# Patient Record
Sex: Female | Born: 1960 | Race: White | Hispanic: No | Marital: Married | Smoking: Former smoker
Health system: Southern US, Community
[De-identification: ages and names within clinical notes are randomized; demographics above are authoritative.]

## PROBLEM LIST (undated history)

## (undated) DIAGNOSIS — B019 Varicella without complication: Secondary | ICD-10-CM

## (undated) DIAGNOSIS — T7840XA Allergy, unspecified, initial encounter: Secondary | ICD-10-CM

## (undated) DIAGNOSIS — E119 Type 2 diabetes mellitus without complications: Secondary | ICD-10-CM

## (undated) DIAGNOSIS — I509 Heart failure, unspecified: Secondary | ICD-10-CM

## (undated) DIAGNOSIS — K8020 Calculus of gallbladder without cholecystitis without obstruction: Secondary | ICD-10-CM

## (undated) HISTORY — PX: ABDOMINAL HYSTERECTOMY: SHX81

## (undated) HISTORY — DX: Allergy, unspecified, initial encounter: T78.40XA

## (undated) HISTORY — PX: CHOLECYSTECTOMY: SHX55

## (undated) HISTORY — DX: Heart failure, unspecified: I50.9

## (undated) HISTORY — PX: SPINE SURGERY: SHX786

## (undated) HISTORY — DX: Type 2 diabetes mellitus without complications: E11.9

## (undated) HISTORY — DX: Calculus of gallbladder without cholecystitis without obstruction: K80.20

## (undated) HISTORY — DX: Varicella without complication: B01.9

---

## 1997-09-30 ENCOUNTER — Ambulatory Visit (HOSPITAL_COMMUNITY): Admission: RE | Admit: 1997-09-30 | Discharge: 1997-09-30 | Payer: Self-pay | Admitting: *Deleted

## 1998-06-22 ENCOUNTER — Other Ambulatory Visit: Admission: RE | Admit: 1998-06-22 | Discharge: 1998-06-22 | Payer: Self-pay | Admitting: *Deleted

## 1999-07-18 ENCOUNTER — Other Ambulatory Visit: Admission: RE | Admit: 1999-07-18 | Discharge: 1999-07-18 | Payer: Self-pay | Admitting: *Deleted

## 2012-10-28 ENCOUNTER — Encounter (INDEPENDENT_AMBULATORY_CARE_PROVIDER_SITE_OTHER): Payer: Self-pay

## 2012-10-29 ENCOUNTER — Other Ambulatory Visit: Payer: Self-pay | Admitting: Obstetrics and Gynecology

## 2012-10-29 DIAGNOSIS — R1011 Right upper quadrant pain: Secondary | ICD-10-CM

## 2012-10-31 ENCOUNTER — Ambulatory Visit (INDEPENDENT_AMBULATORY_CARE_PROVIDER_SITE_OTHER): Payer: BC Managed Care – PPO | Admitting: Surgery

## 2012-11-03 ENCOUNTER — Ambulatory Visit
Admission: RE | Admit: 2012-11-03 | Discharge: 2012-11-03 | Disposition: A | Discharge status: Home / Self Care | Payer: BLUE CROSS/BLUE SHIELD | Source: Ambulatory Visit | Attending: Obstetrics and Gynecology | Admitting: Obstetrics and Gynecology

## 2012-11-03 DIAGNOSIS — R1011 Right upper quadrant pain: Secondary | ICD-10-CM

## 2013-09-28 ENCOUNTER — Other Ambulatory Visit: Payer: Self-pay | Admitting: Obstetrics and Gynecology

## 2013-09-28 DIAGNOSIS — N63 Unspecified lump in unspecified breast: Secondary | ICD-10-CM

## 2013-10-05 ENCOUNTER — Other Ambulatory Visit: Payer: Self-pay | Admitting: Obstetrics and Gynecology

## 2013-10-05 ENCOUNTER — Ambulatory Visit
Admission: RE | Admit: 2013-10-05 | Discharge: 2013-10-05 | Disposition: A | Discharge status: Home / Self Care | Payer: BC Managed Care – PPO | Source: Ambulatory Visit | Attending: Obstetrics and Gynecology | Admitting: Obstetrics and Gynecology

## 2013-10-05 DIAGNOSIS — N63 Unspecified lump in unspecified breast: Secondary | ICD-10-CM

## 2013-10-05 DIAGNOSIS — N6489 Other specified disorders of breast: Secondary | ICD-10-CM

## 2013-10-05 DIAGNOSIS — R928 Other abnormal and inconclusive findings on diagnostic imaging of breast: Secondary | ICD-10-CM

## 2014-06-25 ENCOUNTER — Other Ambulatory Visit: Payer: Self-pay

## 2015-06-23 ENCOUNTER — Other Ambulatory Visit: Payer: Self-pay | Admitting: Obstetrics and Gynecology

## 2015-06-23 DIAGNOSIS — N6489 Other specified disorders of breast: Secondary | ICD-10-CM

## 2015-06-30 ENCOUNTER — Other Ambulatory Visit: Payer: Self-pay

## 2015-07-08 ENCOUNTER — Ambulatory Visit
Admission: RE | Admit: 2015-07-08 | Discharge: 2015-07-08 | Disposition: A | Discharge status: Home / Self Care | Payer: BLUE CROSS/BLUE SHIELD | Source: Ambulatory Visit | Attending: Obstetrics and Gynecology | Admitting: Obstetrics and Gynecology

## 2015-07-08 DIAGNOSIS — N6489 Other specified disorders of breast: Secondary | ICD-10-CM

## 2017-03-05 ENCOUNTER — Encounter: Payer: Self-pay | Admitting: Internal Medicine

## 2017-03-05 ENCOUNTER — Ambulatory Visit (INDEPENDENT_AMBULATORY_CARE_PROVIDER_SITE_OTHER): Payer: BLUE CROSS/BLUE SHIELD | Admitting: Internal Medicine

## 2017-03-05 ENCOUNTER — Ambulatory Visit (INDEPENDENT_AMBULATORY_CARE_PROVIDER_SITE_OTHER)
Admission: RE | Admit: 2017-03-05 | Discharge: 2017-03-05 | Disposition: A | Discharge status: Home / Self Care | Payer: BLUE CROSS/BLUE SHIELD | Source: Ambulatory Visit | Attending: Internal Medicine | Admitting: Internal Medicine

## 2017-03-05 VITALS — BP 130/78 | HR 73 | Temp 98.4°F | Ht 65.75 in | Wt 187.0 lb

## 2017-03-05 DIAGNOSIS — M5410 Radiculopathy, site unspecified: Secondary | ICD-10-CM

## 2017-03-05 DIAGNOSIS — Z01818 Encounter for other preprocedural examination: Secondary | ICD-10-CM | POA: Insufficient documentation

## 2017-03-05 DIAGNOSIS — Z8249 Family history of ischemic heart disease and other diseases of the circulatory system: Secondary | ICD-10-CM | POA: Insufficient documentation

## 2017-03-05 DIAGNOSIS — K8020 Calculus of gallbladder without cholecystitis without obstruction: Secondary | ICD-10-CM | POA: Insufficient documentation

## 2017-03-05 HISTORY — DX: Family history of ischemic heart disease and other diseases of the circulatory system: Z82.49

## 2017-03-05 HISTORY — DX: Encounter for other preprocedural examination: Z01.818

## 2017-03-05 HISTORY — DX: Calculus of gallbladder without cholecystitis without obstruction: K80.20

## 2017-03-05 MED ORDER — VITAMIN D3 50 MCG (2000 UT) PO CAPS: 2000.0000 [IU] | ORAL_CAPSULE | Freq: Every day | ORAL | 3 refills | Status: AC

## 2017-03-05 NOTE — Progress Notes (Signed)
Subjective:  Patient ID: Kristy Keller, female    DOB: 23-Oct-1960  Age: 56 y.o. MRN: [id number]  CC: No chief complaint on file.   HPI Kristy Keller presents for a new pt visit Req by Dr Sherlyn Lick Reason: back surgery (L4 decompression and fusion) med clearance Hx: 56 yo w/LBP irrad down to the R leg x 4 weeks. It started on 01/26/17 when she lifted a heavy roll of fabric at work. RLE weakness is getting worse.  She has a h/o single large gallstone 2014 - never removed; pain off and on x years   Past Medical History:  Diagnosis Date  . Chicken pox   . Gallstones    Past Surgical History:  Procedure Laterality Date  . ABDOMINAL HYSTERECTOMY     partial    reports that she has quit smoking. She has never used smokeless tobacco. She reports that she does not drink alcohol or use drugs. family history includes Cancer (age of onset: 52) in her father; Diabetes in her sister; Heart disease (age of onset: 51) in her brother; Heart disease (age of onset: 35) in her mother. No Known Allergies  No outpatient prescriptions prior to visit.   No facility-administered medications prior to visit.     ROS Review of Systems  Constitutional: Negative for activity change, appetite change, chills, fatigue and unexpected weight change.  HENT: Negative for congestion, mouth sores and sinus pressure.   Eyes: Negative for visual disturbance.  Respiratory: Negative for cough and chest tightness.   Gastrointestinal: Positive for abdominal pain. Negative for nausea.  Genitourinary: Negative for difficulty urinating, frequency and vaginal pain.  Musculoskeletal: Positive for back pain and gait problem.  Skin: Negative for pallor and rash.  Neurological: Positive for weakness. Negative for dizziness, tremors, numbness and headaches.  Psychiatric/Behavioral: Negative for confusion, sleep disturbance and suicidal ideas. The patient is not nervous/anxious.     Objective:  BP 130/78 (BP  Location: Left Arm, Patient Position: Sitting, Cuff Size: Large)   Pulse 73   Temp 98.4 F (36.9 C) (Oral)   Ht 5' 5.75" (1.67 m)   Wt 187 lb (84.8 kg)   SpO2 98%   BMI 30.41 kg/m   BP Readings from Last 3 Encounters:  03/05/17 130/78    Wt Readings from Last 3 Encounters:  03/05/17 187 lb (84.8 kg)    Physical Exam  Constitutional: She appears well-developed. No distress.  HENT:  Head: Normocephalic.  Right Ear: External ear normal.  Left Ear: External ear normal.  Nose: Nose normal.  Mouth/Throat: Oropharynx is clear and moist.  Eyes: Pupils are equal, round, and reactive to light. Conjunctivae are normal. Right eye exhibits no discharge. Left eye exhibits no discharge.  Neck: Normal range of motion. Neck supple. No JVD present. No tracheal deviation present. No thyromegaly present.  Cardiovascular: Normal rate, regular rhythm and normal heart sounds.   Pulmonary/Chest: No stridor. No respiratory distress. She has no wheezes.  Abdominal: Soft. Bowel sounds are normal. She exhibits no distension and no mass. There is no tenderness. There is no rebound and no guarding.  Musculoskeletal: She exhibits tenderness. She exhibits no edema.  Lymphadenopathy:    She has no cervical adenopathy.  Neurological: She displays abnormal reflex. No cranial nerve deficit. She exhibits abnormal muscle tone. Coordination abnormal.  Skin: No rash noted. No erythema.  Psychiatric: She has a normal mood and affect. Her behavior is normal. Judgment and thought content normal.  RLE weak 3+/5 strength in knee,  ankle extensors and flexors; no knee, achilles reflex R thigh extensors/flexors 4+/5 Str leg (-) B LS tender w/ROM Limp  Procedure: EKG Indication: pre-op Impression: NSR. No acute changes.   No results found for: WBC, HGB, HCT, PLT, GLUCOSE, CHOL, TRIG, HDL, LDLDIRECT, LDLCALC, ALT, AST, NA, K, CL, CREATININE, BUN, CO2, TSH, PSA, INR, GLUF, HGBA1C, MICROALBUR  Mm Diag Breast Tomo  Bilateral  Result Date: 07/08/2015 CLINICAL DATA:  Delayed follow-up for a screening recall of a left breast asymmetry. EXAM: DIGITAL DIAGNOSTIC BILATERAL MAMMOGRAM WITH 3D TOMOSYNTHESIS AND CAD COMPARISON:  Previous exam(s). ACR Breast Density Category b: There are scattered areas of fibroglandular density. FINDINGS: The asymmetry of concern in the subareolar left breast resolves on today's images, most likely representing overlapping fibroglandular tissue. No suspicious calcifications, masses or areas of distortion are seen in the bilateral breasts. Mammographic images were processed with CAD. IMPRESSION: 1. Resolution of the asymmetry of concern in the subareolar left breast. This most likely represented overlapping fibroglandular tissue. 2.  No mammographic evidence of malignancy in the bilateral breasts. RECOMMENDATION: Screening mammogram in one year.(Code:[id number]-B-01Y) I have discussed the findings and recommendations with the patient. Results were also provided in writing at the conclusion of the visit. If applicable, a reminder letter will be sent to the patient regarding the next appointment. BI-RADS CATEGORY  1: Negative. Electronically Signed   By: Ammie Ferrier M.D.   On: 07/08/2015 15:57    Assessment & Plan:   There are no diagnoses linked to this encounter. I am having Ms. Haacke maintain her gabapentin, estrogen (conjugated)-medroxyprogesterone, and aspirin EC.  Meds ordered this encounter  Medications  . gabapentin (NEURONTIN) 300 MG capsule    Sig: TAKE 1 CAPSULE IN THE MORNING AND TAKE 2 CAPSULES AT BEDTIME    Refill:  1  . estrogen, conjugated,-medroxyprogesterone (PREMPRO) 0.3-1.5 MG tablet    Sig: Take by mouth.  Marland Kitchen aspirin EC 81 MG tablet    Sig: Take 81 mg by mouth daily.     Follow-up: No Follow-up on file.  Walker Kehr, MD

## 2017-03-05 NOTE — Patient Instructions (Signed)
Use [employer] Peroxicare tooth paste  

## 2017-03-05 NOTE — Assessment & Plan Note (Addendum)
EKG - nl Labs Stress test discussed. Kristy Keller declined. She was very active prior to her radiculopathy w/o CPs Baby ASA qd to continue

## 2017-03-05 NOTE — Assessment & Plan Note (Addendum)
Repeat abdominal US Low fat diet Labs

## 2017-03-05 NOTE — Assessment & Plan Note (Addendum)
Kristy Keller is medically clear for her L4 decompression/fusion. Thank you! EKG - nl Labs, CXR - OK Stress test discussed. Kristy Keller declined. She was very active prior to her radiculopathy w/o CPs Baby ASA qd Repeat abdominal US to f/u on her gallstone Low fat diet

## 2017-03-06 DIAGNOSIS — M5410 Radiculopathy, site unspecified: Secondary | ICD-10-CM

## 2017-03-06 HISTORY — DX: Radiculopathy, site unspecified: M54.10

## 2017-03-06 NOTE — Assessment & Plan Note (Signed)
Kristy Keller is medically clear for her L4 decompression/fusion. Thank you! EKG - nl Labs, CXR - OK Stress test discussed. Kristy Keller declined. She was very active prior to her radiculopathy w/o CPs Baby ASA qd Repeat abdominal US to f/u on her gallstone Low fat diet

## 2017-03-07 ENCOUNTER — Other Ambulatory Visit (INDEPENDENT_AMBULATORY_CARE_PROVIDER_SITE_OTHER): Payer: BLUE CROSS/BLUE SHIELD

## 2017-03-07 DIAGNOSIS — Z01818 Encounter for other preprocedural examination: Secondary | ICD-10-CM

## 2017-03-07 LAB — BASIC METABOLIC PANEL
BUN: 10 mg/dL (ref 6–23)
CALCIUM: 9.9 mg/dL (ref 8.4–10.5)
CO2: 32 meq/L (ref 19–32)
CREATININE: 0.99 mg/dL (ref 0.40–1.20)
Chloride: 105 mEq/L (ref 96–112)
GFR: 61.29 mL/min (ref >60.00)
GLUCOSE: 84 mg/dL (ref 70–99)
POTASSIUM: 4.3 meq/L (ref 3.5–5.1)
SODIUM: 143 meq/L (ref 135–145)

## 2017-03-07 LAB — TSH: TSH: 0.31 u[IU]/mL — ABNORMAL LOW (ref 0.35–4.50)

## 2017-03-07 LAB — CBC WITH DIFFERENTIAL/PLATELET
BASOS ABS: 0 10*3/uL (ref 0.0–0.1)
Basophils Relative: 0.8 % (ref 0.0–3.0)
EOS ABS: 0 10*3/uL (ref 0.0–0.7)
Eosinophils Relative: 0.6 % (ref 0.0–5.0)
HCT: 43.8 % (ref 36.0–46.0)
Hemoglobin: 14.6 g/dL (ref 12.0–15.0)
LYMPHS ABS: 1.6 10*3/uL (ref 0.7–4.0)
LYMPHS PCT: 34.5 % (ref 12.0–46.0)
MCHC: 33.4 g/dL (ref 30.0–36.0)
MCV: 92.1 fl (ref 78.0–100.0)
Monocytes Absolute: 0.4 10*3/uL (ref 0.1–1.0)
Monocytes Relative: 7.8 % (ref 3.0–12.0)
NEUTROS ABS: 2.5 10*3/uL (ref 1.4–7.7)
NEUTROS PCT: 56.3 % (ref 43.0–77.0)
PLATELETS: 271 10*3/uL (ref 150.0–400.0)
RBC: 4.76 Mil/uL (ref 3.87–5.11)
RDW: 13.5 % (ref 11.5–15.5)
WBC: 4.5 10*3/uL (ref 4.0–10.5)

## 2017-03-07 LAB — APTT: aPTT: 29.3 s (ref 23.4–32.7)

## 2017-03-07 LAB — HEPATIC FUNCTION PANEL
ALK PHOS: 41 U/L (ref 39–117)
ALT: 34 U/L (ref 0–35)
AST: 21 U/L (ref 0–37)
Albumin: 4.4 g/dL (ref 3.5–5.2)
BILIRUBIN DIRECT: 0.2 mg/dL (ref 0.0–0.3)
Total Bilirubin: 0.9 mg/dL (ref 0.2–1.2)
Total Protein: 7 g/dL (ref 6.0–8.3)

## 2017-03-07 LAB — URINALYSIS
Bilirubin Urine: NEGATIVE
Hgb urine dipstick: NEGATIVE
KETONES UR: NEGATIVE
Leukocytes, UA: NEGATIVE
Nitrite: NEGATIVE
PH: 5.5 (ref 5.0–8.0)
Total Protein, Urine: NEGATIVE
URINE GLUCOSE: NEGATIVE
Urobilinogen, UA: 0.2 (ref 0.0–1.0)

## 2017-03-07 LAB — PROTIME-INR
INR: 1.1 ratio — ABNORMAL HIGH (ref 0.8–1.0)
PROTHROMBIN TIME: 11.4 s (ref 9.6–13.1)

## 2017-03-07 LAB — LIPID PANEL
CHOL/HDL RATIO: 3
Cholesterol: 133 mg/dL (ref 0–200)
HDL: 47.7 mg/dL (ref >39.00)
LDL Cholesterol: 71 mg/dL (ref 0–99)
NONHDL: 85.45
Triglycerides: 73 mg/dL (ref 0.0–149.0)
VLDL: 14.6 mg/dL (ref 0.0–40.0)

## 2017-03-27 ENCOUNTER — Ambulatory Visit
Admission: RE | Admit: 2017-03-27 | Discharge: 2017-03-27 | Disposition: A | Discharge status: Home / Self Care | Payer: BLUE CROSS/BLUE SHIELD | Source: Ambulatory Visit | Attending: Internal Medicine | Admitting: Internal Medicine

## 2017-03-27 DIAGNOSIS — Z01818 Encounter for other preprocedural examination: Secondary | ICD-10-CM

## 2017-03-27 DIAGNOSIS — K8020 Calculus of gallbladder without cholecystitis without obstruction: Secondary | ICD-10-CM

## 2017-04-01 ENCOUNTER — Telehealth: Payer: Self-pay | Admitting: Internal Medicine

## 2017-04-01 NOTE — Telephone Encounter (Signed)
Pt is scheduled to have surgery on 11.7, they receive the surgical clearance form but  They need to know if it is ok for the patient to stop taking Aspirin 7 days prior to her surgery. Please advise   Please fax note giving premission to fax number [phone]

## 2017-04-02 ENCOUNTER — Encounter: Payer: Self-pay | Admitting: Internal Medicine

## 2017-04-02 NOTE — Telephone Encounter (Signed)
Okay to write

## 2017-04-03 ENCOUNTER — Telehealth: Payer: Self-pay | Admitting: Internal Medicine

## 2017-04-03 NOTE — Telephone Encounter (Signed)
Patient is having surgery 11/7.  Has faxed surgery clear ence over.  Needs back ASAP.  Will fax back again to provider on A side in case not received the first time.  Please fax back to [phone] Attn: Gabriela Eves.

## 2017-04-03 NOTE — Telephone Encounter (Signed)
Okay faxed.

## 2017-04-03 NOTE — Telephone Encounter (Signed)
faxed

## 2017-04-03 NOTE — Telephone Encounter (Signed)
Ok Thx 

## 2017-04-12 DIAGNOSIS — M4727 Other spondylosis with radiculopathy, lumbosacral region: Secondary | ICD-10-CM | POA: Insufficient documentation

## 2017-04-12 HISTORY — DX: Other spondylosis with radiculopathy, lumbosacral region: M47.27

## 2017-04-12 MED ORDER — ACETAMINOPHEN 325 MG PO TABS
650.00 mg | ORAL_TABLET | ORAL | Status: DC
Start: ? — End: 2017-04-12

## 2017-04-12 MED ORDER — HYDROCODONE-ACETAMINOPHEN 5-325 MG PO TABS
1.00 | ORAL_TABLET | ORAL | Status: DC
Start: ? — End: 2017-04-12

## 2017-04-12 MED ORDER — OXYCODONE-ACETAMINOPHEN 5-325 MG PO TABS
1.00 | ORAL_TABLET | ORAL | Status: DC
Start: ? — End: 2017-04-12

## 2017-04-12 MED ORDER — DOCUSATE SODIUM 100 MG PO CAPS
100.00 mg | ORAL_CAPSULE | ORAL | Status: DC
Start: 2017-04-12 — End: 2017-04-12

## 2017-04-12 MED ORDER — HYDROCODONE-ACETAMINOPHEN 5-325 MG PO TABS
2.00 | ORAL_TABLET | ORAL | Status: DC
Start: ? — End: 2017-04-12

## 2017-04-12 MED ORDER — PHENOL 1.4 % MT LIQD
1.00 | OROMUCOSAL | Status: DC
Start: ? — End: 2017-04-12

## 2017-04-12 MED ORDER — CHOLECALCIFEROL 25 MCG (1000 UT) PO TABS
2000.00 | ORAL_TABLET | ORAL | Status: DC
Start: 2017-04-12 — End: 2017-04-12

## 2017-04-12 MED ORDER — ONDANSETRON HCL 4 MG/2ML IJ SOLN
4.00 mg | INTRAMUSCULAR | Status: DC
Start: ? — End: 2017-04-12

## 2017-04-12 MED ORDER — POTASSIUM CHLORIDE IN NACL 20-0.9 MEQ/L-% IV SOLN
INTRAVENOUS | Status: DC
Start: ? — End: 2017-04-12

## 2017-04-12 MED ORDER — ESTROGENS CONJUGATED 0.625 MG PO TABS
1.25 mg | ORAL_TABLET | ORAL | Status: DC
Start: 2017-04-12 — End: 2017-04-12

## 2017-04-12 MED ORDER — METHOCARBAMOL 750 MG PO TABS
750.00 mg | ORAL_TABLET | ORAL | Status: DC
Start: 2017-04-12 — End: 2017-04-12

## 2017-04-12 MED ORDER — MORPHINE SULFATE (PF) 2 MG/ML IV SOLN
2.00 mg | INTRAVENOUS | Status: DC
Start: ? — End: 2017-04-12

## 2017-04-12 MED ORDER — SENNOSIDES-DOCUSATE SODIUM 8.6-50 MG PO TABS
1.00 | ORAL_TABLET | ORAL | Status: DC
Start: 2017-04-12 — End: 2017-04-12

## 2017-04-12 MED ORDER — PROMETHAZINE HCL 25 MG PO TABS
25.00 mg | ORAL_TABLET | ORAL | Status: DC
Start: ? — End: 2017-04-12

## 2017-04-12 MED ORDER — GABAPENTIN 300 MG PO CAPS
300.00 mg | ORAL_CAPSULE | ORAL | Status: DC
Start: 2017-04-12 — End: 2017-04-12

## 2017-04-12 MED ORDER — KETOROLAC TROMETHAMINE 15 MG/ML IJ SOLN
15.00 mg | INTRAMUSCULAR | Status: DC
Start: ? — End: 2017-04-12

## 2017-05-22 DIAGNOSIS — M5431 Sciatica, right side: Secondary | ICD-10-CM | POA: Diagnosis not present

## 2017-05-22 DIAGNOSIS — Z79891 Long term (current) use of opiate analgesic: Secondary | ICD-10-CM | POA: Diagnosis not present

## 2017-05-22 DIAGNOSIS — M48062 Spinal stenosis, lumbar region with neurogenic claudication: Secondary | ICD-10-CM | POA: Diagnosis not present

## 2017-05-22 DIAGNOSIS — Z981 Arthrodesis status: Secondary | ICD-10-CM | POA: Diagnosis not present

## 2017-05-22 DIAGNOSIS — M4726 Other spondylosis with radiculopathy, lumbar region: Secondary | ICD-10-CM | POA: Diagnosis not present

## 2017-05-22 DIAGNOSIS — M5116 Intervertebral disc disorders with radiculopathy, lumbar region: Secondary | ICD-10-CM | POA: Diagnosis not present

## 2017-05-22 DIAGNOSIS — G43909 Migraine, unspecified, not intractable, without status migrainosus: Secondary | ICD-10-CM | POA: Diagnosis not present

## 2017-05-22 DIAGNOSIS — Z4789 Encounter for other orthopedic aftercare: Secondary | ICD-10-CM | POA: Diagnosis not present

## 2017-06-05 ENCOUNTER — Ambulatory Visit: Payer: BLUE CROSS/BLUE SHIELD | Admitting: Internal Medicine

## 2017-06-05 ENCOUNTER — Encounter: Payer: Self-pay | Admitting: Internal Medicine

## 2017-06-05 DIAGNOSIS — Z8249 Family history of ischemic heart disease and other diseases of the circulatory system: Secondary | ICD-10-CM | POA: Diagnosis not present

## 2017-06-05 DIAGNOSIS — R609 Edema, unspecified: Secondary | ICD-10-CM

## 2017-06-05 DIAGNOSIS — M5410 Radiculopathy, site unspecified: Secondary | ICD-10-CM | POA: Diagnosis not present

## 2017-06-05 HISTORY — DX: Edema, unspecified: R60.9

## 2017-06-05 NOTE — Assessment & Plan Note (Signed)
LBP - s/p L4 decompr and fusion surgery Apr 10, 2017. LBP and RLE pain is better She will start PT

## 2017-06-05 NOTE — Assessment & Plan Note (Addendum)
Due to gabapentin - resolved off Rx

## 2017-06-05 NOTE — Assessment & Plan Note (Signed)
ASA

## 2017-06-05 NOTE — Progress Notes (Signed)
Subjective:  Patient ID: Kristy Keller, female    DOB: [birth year]  Age: 57 y.o. MRN: [id number]  CC: No chief complaint on file.   HPI ALESA ECHEVARRIA presents for LBP - s/p L4 decompr and fusion surgery Apr 10, 2017. LBP and RLE pain is better  Outpatient Medications Prior to Visit  Medication Sig Dispense Refill  . acetaminophen (TYLENOL) 500 MG tablet Take 500 mg by mouth every 5 (five) hours as needed.    Marland Kitchen aspirin EC 81 MG tablet Take 81 mg by mouth daily.    . Cholecalciferol (VITAMIN D3) 2000 units capsule Take 1 capsule (2,000 Units total) by mouth daily. 100 capsule 3  . estrogen, conjugated,-medroxyprogesterone (PREMPRO) 0.3-1.5 MG tablet Take by mouth.    . gabapentin (NEURONTIN) 300 MG capsule TAKE 1 CAPSULE IN THE MORNING AND TAKE 2 CAPSULES AT BEDTIME  1   No facility-administered medications prior to visit.     ROS Review of Systems  Constitutional: Negative for activity change, appetite change, chills, fatigue and unexpected weight change.  HENT: Negative for congestion, mouth sores and sinus pressure.   Eyes: Negative for visual disturbance.  Respiratory: Negative for cough and chest tightness.   Cardiovascular: Negative for leg swelling.  Gastrointestinal: Negative for abdominal pain and nausea.  Genitourinary: Negative for difficulty urinating, frequency and vaginal pain.  Musculoskeletal: Positive for back pain and gait problem.  Skin: Negative for pallor and rash.  Neurological: Negative for dizziness, tremors, weakness, numbness and headaches.  Psychiatric/Behavioral: Negative for confusion and sleep disturbance.    Objective:  BP 130/84 (BP Location: Left Arm, Patient Position: Sitting, Cuff Size: Large)   Pulse 83   Temp 98.3 F (36.8 C) (Oral)   Ht 5' 5.75" (1.67 m)   Wt 201 lb (91.2 kg)   SpO2 98%   BMI 32.69 kg/m   BP Readings from Last 3 Encounters:  06/05/17 130/84  03/05/17 130/78    Wt Readings from Last 3 Encounters:    06/05/17 201 lb (91.2 kg)  03/05/17 187 lb (84.8 kg)    Physical Exam  Constitutional: She appears well-developed. No distress.  HENT:  Head: Normocephalic.  Right Ear: External ear normal.  Left Ear: External ear normal.  Nose: Nose normal.  Mouth/Throat: Oropharynx is clear and moist.  Eyes: Conjunctivae are normal. Pupils are equal, round, and reactive to light. Right eye exhibits no discharge. Left eye exhibits no discharge.  Neck: Normal range of motion. Neck supple. No JVD present. No tracheal deviation present. No thyromegaly present.  Cardiovascular: Normal rate, regular rhythm and normal heart sounds.  Pulmonary/Chest: No stridor. No respiratory distress. She has no wheezes.  Abdominal: Soft. Bowel sounds are normal. She exhibits no distension and no mass. There is no tenderness. There is no rebound and no guarding.  Musculoskeletal: She exhibits tenderness. She exhibits no edema.  Lymphadenopathy:    She has no cervical adenopathy.  Neurological: She displays normal reflexes. No cranial nerve deficit. She exhibits normal muscle tone. Coordination abnormal.  Skin: No rash noted. No erythema.  Psychiatric: She has a normal mood and affect. Her behavior is normal. Judgment and thought content normal.  Cane, limp LS tender w/ROM Scar is healing  Lab Results  Component Value Date   WBC 4.5 03/07/2017   HGB 14.6 03/07/2017   HCT 43.8 03/07/2017   PLT 271.0 03/07/2017   GLUCOSE 84 03/07/2017   CHOL 133 03/07/2017   TRIG 73.0 03/07/2017   HDL 47.70 03/07/2017  LDLCALC 71 03/07/2017   ALT 34 03/07/2017   AST 21 03/07/2017   NA 143 03/07/2017   K 4.3 03/07/2017   CL 105 03/07/2017   CREATININE 0.99 03/07/2017   BUN 10 03/07/2017   CO2 32 03/07/2017   TSH 0.31 (L) 03/07/2017   INR 1.1 (H) 03/07/2017    US Abdomen Complete  Result Date: 03/27/2017 CLINICAL DATA:  Previous cholelithiasis EXAM: ABDOMEN ULTRASOUND COMPLETE COMPARISON:  November 03, 2012 FINDINGS:  Gallbladder: Within the gallbladder, there are several confluent echogenic foci which move and shadow consistent with cholelithiasis. The confluence of gallstones measures 2.5 cm in length, similar to prior study. There is no appreciable gallbladder wall thickening or pericholecystic fluid. No sonographic Murphy sign noted by sonographer. Common bile duct: Diameter: 4 mm. No intrahepatic, common hepatic, or common bile duct dilatation. Liver: There is a cyst in the posterior segment right lobe of the liver near the hepatorenal fossa measuring 2.3 x 2.0 x 2.4 cm. No other focal liver lesion is evident. Liver echogenicity overall is increased. Portal vein is patent on color Doppler imaging with normal direction of blood flow towards the liver. IVC: No abnormality visualized. Pancreas: Visualized portion unremarkable. Portions of pancreas obscured by gas. Spleen: Size and appearance within normal limits. Right Kidney: Length: 10.5 cm. Echogenicity within normal limits. No mass or hydronephrosis visualized. Left Kidney: Length: 12.3 cm. Echogenicity within normal limits. No mass or hydronephrosis visualized. Abdominal aorta: No aneurysm visualized. Other findings: No demonstrable ascites. IMPRESSION: 1. Cholelithiasis with confluence of gallstones measuring 2.5 cm in length, similar to prior study. No gallbladder wall thickening or pericholecystic fluid. 2. Cyst in right lobe of liver. No other focal liver lesions are identified. Increased liver echogenicity most likely is indicative of hepatic steatosis. It should be noted that the sensitivity of ultrasound for detection of noncystic liver lesions is diminished in this circumstance. 3. Portions of pancreas obscured by gas. Visualized portions of pancreas appear normal. 4.  Study otherwise unremarkable. Electronically Signed   By: Lowella Grip III M.D.   On: 03/27/2017 11:16    Assessment & Plan:   There are no diagnoses linked to this encounter. I have  discontinued Alfred C. Chiquito's gabapentin. I am also having her maintain her estrogen (conjugated)-medroxyprogesterone, aspirin EC, Vitamin D3, and acetaminophen.  No orders of the defined types were placed in this encounter.    Follow-up: No Follow-up on file.  Walker Kehr, MD

## 2017-07-19 ENCOUNTER — Encounter: Payer: Self-pay | Admitting: Internal Medicine

## 2017-07-19 ENCOUNTER — Ambulatory Visit: Payer: BLUE CROSS/BLUE SHIELD | Admitting: Internal Medicine

## 2017-07-19 DIAGNOSIS — L723 Sebaceous cyst: Secondary | ICD-10-CM | POA: Diagnosis not present

## 2017-07-19 DIAGNOSIS — L089 Local infection of the skin and subcutaneous tissue, unspecified: Secondary | ICD-10-CM | POA: Diagnosis not present

## 2017-07-19 HISTORY — DX: Local infection of the skin and subcutaneous tissue, unspecified: L08.9

## 2017-07-19 MED ORDER — DOXYCYCLINE HYCLATE 100 MG PO TABS: 100.0000 mg | ORAL_TABLET | Freq: Two times a day (BID) | ORAL | 0 refills | Status: DC

## 2017-07-19 MED ORDER — MUPIROCIN 2 % EX OINT: TOPICAL_OINTMENT | CUTANEOUS | 0 refills | Status: DC

## 2017-07-19 NOTE — Patient Instructions (Addendum)
Epidermal Cyst Removal, Care After Refer to this sheet in the next few weeks. These instructions provide you with information about caring for yourself after your procedure. Your health care provider may also give you more specific instructions. Your treatment has been planned according to current medical practices, but problems sometimes occur. Call your health care provider if you have any problems or questions after your procedure. What can I expect after the procedure? After the procedure, it is common to have:  Soreness in the area where your cyst was removed.  Tightness or itching from your skin sutures.  Follow these instructions at home:  Take medicines only as directed by your health care provider.  If you were prescribed an antibiotic medicine, finish all of it even if you start to feel better.  Use antibiotic ointment as directed by your health care provider. Follow the instructions carefully.  There are many different ways to close and cover an incision, including stitches (sutures), skin glue, and adhesive strips. Follow your health care provider's instructions about: ? Incision care. ? Bandage (dressing) changes and removal. ? Incision closure removal.  Keep the bandage (dressing) dry until your health care provider says that it can be removed. Take sponge baths only. Ask your health care provider when you can start showering or taking a bath.  After your dressing is off, check your incision every day for signs of infection. Watch for: ? Redness, swelling, or pain. ? Fluid, blood, or pus.  You can return to your normal activities. Do not do anything that stretches or puts pressure on your incision.  You can return to your normal diet.  Keep all follow-up visits as directed by your health care provider. This is important. Contact a health care provider if:  You have a fever.  Your incision bleeds.  You have redness, swelling, or pain in the incision area.  You  have fluid, blood, or pus coming from your incision.  Your cyst comes back after surgery. This information is not intended to replace advice given to you by your health care provider. Make sure you discuss any questions you have with your health care provider. Document Released: 06/11/2014 Document Revised: 10/27/2015 Document Reviewed: 02/03/2014 Elsevier Interactive Patient Education  2018 [employer].      Wound instructions : change dressing once a day or twice a day is needed. Change dressing after  shower in the morning.  Pat dry the wound with gauze. Pull out one inch of packing everyday and cut it off. Re-dress wound with antibiotic ointment and Telfa pad or a Band-Aid of appropriate size.   Please contact us if you notice a recollection of pus in the abscess fever and chills increased pain redness red streaks near the abscess increased swelling in the area.

## 2017-07-19 NOTE — Progress Notes (Signed)
Subjective:  Patient ID: Kristy Keller, female    DOB: 1961-01-17  Age: 57 y.o. MRN: [id number]  CC: No chief complaint on file.   HPI [name] presents for a painful swelling on the upper back x 2-3 d - worse now  Outpatient Medications Prior to Visit  Medication Sig Dispense Refill  . acetaminophen (TYLENOL) 500 MG tablet Take 500 mg by mouth every 5 (five) hours as needed.    Marland Kitchen aspirin EC 81 MG tablet Take 81 mg by mouth daily.    . Cholecalciferol (VITAMIN D3) 2000 units capsule Take 1 capsule (2,000 Units total) by mouth daily. 100 capsule 3  . estrogen, conjugated,-medroxyprogesterone (PREMPRO) 0.3-1.5 MG tablet Take by mouth.     No facility-administered medications prior to visit.     ROS Review of Systems  Constitutional: Negative for chills and fever.  Cardiovascular: Negative for chest pain.  Musculoskeletal: Positive for back pain. Negative for myalgias.  Skin: Positive for color change.    Objective:  BP 138/84 (BP Location: Left Arm, Patient Position: Sitting, Cuff Size: Large)   Pulse 97   Temp 98.8 F (37.1 C) (Oral)   Ht 5' 5.75" (1.67 m)   Wt 208 lb (94.3 kg)   SpO2 98%   BMI 33.83 kg/m   BP Readings from Last 3 Encounters:  07/19/17 138/84  06/05/17 130/84  03/05/17 130/78    Wt Readings from Last 3 Encounters:  07/19/17 208 lb (94.3 kg)  06/05/17 201 lb (91.2 kg)  03/05/17 187 lb (84.8 kg)    Physical Exam  Constitutional: She appears distressed.  Musculoskeletal: She exhibits tenderness.  Skin: There is erythema.  1.5x1.0 cm eryth swelling between the shoulder blades    Procedure note:  Incision and Drainage of an Abscess   Indication : a localized collection of pus that is tender and not spontaneously resolving.    Risks including unsuccessful procedure , possible need for a repeat procedure due to pus accumulation, scar formation, and others as well as benefits were explained to the patient in detail. Written  consent was obtained/signed.    The patient was placed in a decubitus position. The area of an abscess was prepped with povidone-iodine and draped in a sterile fashion. Local anesthesia with   2    cc of 2% lidocaine and epinephrine  was administered.  1 cm incision with #11strait blade was made. About 3 cc of purulent material was expressed. The abscess cavity was explored with a sterile hemostat and the walled- off pockets and septae were broken down bluntly. The cavity was irrigated with the rest of the anesthetic in the syringe and packed with 2 inches of  the iodoform gauze.   The wound was dressed with antibiotic ointment and Telfa pad.  Tolerated well. Complications: None.   Wound instructions provided.   Lab Results  Component Value Date   WBC 4.5 03/07/2017   HGB 14.6 03/07/2017   HCT 43.8 03/07/2017   PLT 271.0 03/07/2017   GLUCOSE 84 03/07/2017   CHOL 133 03/07/2017   TRIG 73.0 03/07/2017   HDL 47.70 03/07/2017   LDLCALC 71 03/07/2017   ALT 34 03/07/2017   AST 21 03/07/2017   NA 143 03/07/2017   K 4.3 03/07/2017   CL 105 03/07/2017   CREATININE 0.99 03/07/2017   BUN 10 03/07/2017   CO2 32 03/07/2017   TSH 0.31 (L) 03/07/2017   INR 1.1 (H) 03/07/2017    US Abdomen Complete  Result Date: 03/27/2017 CLINICAL DATA:  Previous cholelithiasis EXAM: ABDOMEN ULTRASOUND COMPLETE COMPARISON:  November 03, 2012 FINDINGS: Gallbladder: Within the gallbladder, there are several confluent echogenic foci which move and shadow consistent with cholelithiasis. The confluence of gallstones measures 2.5 cm in length, similar to prior study. There is no appreciable gallbladder wall thickening or pericholecystic fluid. No sonographic Murphy sign noted by sonographer. Common bile duct: Diameter: 4 mm. No intrahepatic, common hepatic, or common bile duct dilatation. Liver: There is a cyst in the posterior segment right lobe of the liver near the hepatorenal fossa measuring 2.3 x 2.0 x 2.4 cm. No other  focal liver lesion is evident. Liver echogenicity overall is increased. Portal vein is patent on color Doppler imaging with normal direction of blood flow towards the liver. IVC: No abnormality visualized. Pancreas: Visualized portion unremarkable. Portions of pancreas obscured by gas. Spleen: Size and appearance within normal limits. Right Kidney: Length: 10.5 cm. Echogenicity within normal limits. No mass or hydronephrosis visualized. Left Kidney: Length: 12.3 cm. Echogenicity within normal limits. No mass or hydronephrosis visualized. Abdominal aorta: No aneurysm visualized. Other findings: No demonstrable ascites. IMPRESSION: 1. Cholelithiasis with confluence of gallstones measuring 2.5 cm in length, similar to prior study. No gallbladder wall thickening or pericholecystic fluid. 2. Cyst in right lobe of liver. No other focal liver lesions are identified. Increased liver echogenicity most likely is indicative of hepatic steatosis. It should be noted that the sensitivity of ultrasound for detection of noncystic liver lesions is diminished in this circumstance. 3. Portions of pancreas obscured by gas. Visualized portions of pancreas appear normal. 4.  Study otherwise unremarkable. Electronically Signed   By: Lowella Grip III M.D.   On: 03/27/2017 11:16    Assessment & Plan:   There are no diagnoses linked to this encounter. I am having Zeenat C. Bergh maintain her estrogen (conjugated)-medroxyprogesterone, aspirin EC, Vitamin D3, and acetaminophen.  No orders of the defined types were placed in this encounter.    Follow-up: No Follow-up on file.  Walker Kehr, MD

## 2017-07-19 NOTE — Assessment & Plan Note (Signed)
I&D Doxy Mupirocin

## 2017-11-04 ENCOUNTER — Ambulatory Visit: Payer: Self-pay | Admitting: *Deleted

## 2017-11-04 NOTE — Telephone Encounter (Signed)
Pt reports swelling feet, ankles, up to knees x 2 weeks "At least."  States "From the gabapentin." Reports she has had swelling BLEs before  from the med and when stopped taking it as directed, swelling subsided. States started back on due to pain issues, but stopped 2 weeks ago and swelling has not subsided. No warmth, redness, denies pain except "If I bump it."  States swelling goes down with elevation. Reports mild SOB with exertion states "From my 25 lb weight gain since back surgery." Appt made with Dr. Alain Marion for Thursday.(Unable to make any appt Wednesday)  Care advise given per protocol. Advised to go to ED if symptoms worsen, any warmth redness, calf pain present. Verbalizes understanding. Reason for Disposition . [1] MODERATE leg swelling (e.g., swelling extends up to knees) AND [2] new onset or worsening    X 2-3 weeks, since off Gabapentin.  Answer Assessment - Initial Assessment Questions 1. ONSET: "When did the swelling start?" (e.g., minutes, hours, days)     2-3 weeks ago 2. LOCATION: "What part of the leg is swollen?"  "Are both legs swollen or just one leg?"     Feet, ankles up to knees 3. SEVERITY: "How bad is the swelling?" (e.g., localized; mild, moderate, severe)  - Localized - small area of swelling localized to one leg  - MILD pedal edema - swelling limited to foot and ankle, pitting edema < 1/4 inch (6 mm) deep, rest and elevation eliminate most or all swelling  - MODERATE edema - swelling of lower leg to knee, pitting edema > 1/4 inch (6 mm) deep, rest and elevation only partially reduce swelling  - SEVERE edema - swelling extends above knee, facial or hand swelling present      Mild, non-pitting, Elevation helps. Laid down all day , went away completely. 4. REDNESS: "Does the swelling look red or infected?"     "Some above ankles.' No pain, warmth, mild redness. 5. PAIN: "Is the swelling painful to touch?" If so, ask: "How painful is it?"   (Scale 1-10; mild, moderate  or severe)     If bumped. 6. FEVER: "Do you have a fever?" If so, ask: "What is it, how was it measured, and when did it start?"      no 7. CAUSE: "What do you think is causing the leg swelling?"     Off neurontin 8. MEDICAL HISTORY: "Do you have a history of heart failure, kidney disease, liver failure, or cancer?"      9. RECURRENT SYMPTOM: "Have you had leg swelling before?" If so, ask: "When was the last time?" "What happened that time?"     3-4 months ago when went off neurontin 10. OTHER SYMPTOMS: "Do you have any other symptoms?" (e.g., chest pain, difficulty breathing)      no  Protocols used: LEG SWELLING AND EDEMA-A-AH

## 2017-11-07 ENCOUNTER — Ambulatory Visit: Payer: BLUE CROSS/BLUE SHIELD | Admitting: Internal Medicine

## 2017-11-07 ENCOUNTER — Encounter: Payer: Self-pay | Admitting: Internal Medicine

## 2017-11-07 ENCOUNTER — Other Ambulatory Visit (INDEPENDENT_AMBULATORY_CARE_PROVIDER_SITE_OTHER): Payer: BLUE CROSS/BLUE SHIELD

## 2017-11-07 VITALS — BP 142/94 | HR 69 | Temp 98.9°F | Ht 65.75 in | Wt 212.0 lb

## 2017-11-07 DIAGNOSIS — K8020 Calculus of gallbladder without cholecystitis without obstruction: Secondary | ICD-10-CM

## 2017-11-07 DIAGNOSIS — R609 Edema, unspecified: Secondary | ICD-10-CM | POA: Diagnosis not present

## 2017-11-07 DIAGNOSIS — I872 Venous insufficiency (chronic) (peripheral): Secondary | ICD-10-CM

## 2017-11-07 HISTORY — DX: Venous insufficiency (chronic) (peripheral): I87.2

## 2017-11-07 LAB — BASIC METABOLIC PANEL
BUN: 13 mg/dL (ref 6–23)
CALCIUM: 10 mg/dL (ref 8.4–10.5)
CO2: 30 mEq/L (ref 19–32)
Chloride: 102 mEq/L (ref 96–112)
Creatinine, Ser: 0.94 mg/dL (ref 0.40–1.20)
GFR: 64.91 mL/min (ref >60.00)
GLUCOSE: 90 mg/dL (ref 70–99)
POTASSIUM: 4.1 meq/L (ref 3.5–5.1)
SODIUM: 139 meq/L (ref 135–145)

## 2017-11-07 LAB — T4, FREE: FREE T4: 0.84 ng/dL (ref 0.60–1.60)

## 2017-11-07 LAB — TSH: TSH: 0.81 u[IU]/mL (ref 0.35–4.50)

## 2017-11-07 MED ORDER — FUROSEMIDE 20 MG PO TABS: 20.0000 mg | ORAL_TABLET | Freq: Every day | ORAL | 3 refills | Status: DC

## 2017-11-07 MED ORDER — POTASSIUM CHLORIDE ER 10 MEQ PO TBCR: 10.0000 meq | EXTENDED_RELEASE_TABLET | Freq: Every day | ORAL | 5 refills | Status: DC

## 2017-11-07 NOTE — Patient Instructions (Signed)
Support socks knee high Elevate legs Low salt diet Loose 10-20 lbs     Chronic Venous Insufficiency Chronic venous insufficiency, also called venous stasis, is a condition that prevents blood from being pumped effectively through the veins in your legs. Blood may no longer be pumped effectively from the legs back to the heart. This condition can range from mild to severe. With proper treatment, you should be able to continue with an active life. What are the causes? Chronic venous insufficiency occurs when the vein walls become stretched, weakened, or damaged, or when valves within the vein are damaged. Some common causes of this include:  High blood pressure inside the veins (venous hypertension).  Increased blood pressure in the leg veins from long periods of sitting or standing.  A blood clot that blocks blood flow in a vein (deep vein thrombosis, DVT).  Inflammation of a vein (phlebitis) that causes a blood clot to form.  Tumors in the pelvis that cause blood to back up.  What increases the risk? The following factors may make you more likely to develop this condition:  Having a family history of this condition.  Obesity.  Pregnancy.  Living without enough physical activity or exercise (sedentary lifestyle).  Smoking.  Having a job that requires long periods of standing or sitting in one place.  Being a certain age. Women in their 79s and 72s and men in their 26s are more likely to develop this condition.  What are the signs or symptoms? Symptoms of this condition include:  Veins that are enlarged, bulging, or twisted (varicose veins).  Skin breakdown or ulcers.  Reddened or discolored skin on the front of the leg.  Brown, smooth, tight, and painful skin just above the ankle, usually on the inside of the leg (lipodermatosclerosis).  Swelling.  How is this diagnosed? This condition may be diagnosed based on:  Your medical history.  A physical  exam.  Tests, such as: ? A procedure that creates an image of a blood vessel and nearby organs and provides information about blood flow through the blood vessel (duplex ultrasound). ? A procedure that tests blood flow (plethysmography). ? A procedure to look at the veins using X-ray and dye (venogram).  How is this treated? The goals of treatment are to help you return to an active life and to minimize pain or disability. Treatment depends on the severity of your condition, and it may include:  Wearing compression stockings. These can help relieve symptoms and help prevent your condition from getting worse. However, they do not cure the condition.  Sclerotherapy. This is a procedure involving an injection of a material that "dissolves" damaged veins.  Surgery. This may involve: ? Removing a diseased vein (vein stripping). ? Cutting off blood flow through the vein (laser ablation surgery). ? Repairing a valve.  Follow these instructions at home:  Wear compression stockings as told by your health care provider. These stockings help to prevent blood clots and reduce swelling in your legs.  Take over-the-counter and prescription medicines only as told by your health care provider.  Stay active by exercising, walking, or doing different activities. Ask your health care provider what activities are safe for you and how much exercise you need.  Drink enough fluid to keep your urine clear or pale yellow.  Do not use any products that contain nicotine or tobacco, such as cigarettes and e-cigarettes. If you need help quitting, ask your health care provider.  Keep all follow-up visits as told  by your health care provider. This is important. Contact a health care provider if:  You have redness, swelling, or more pain in the affected area.  You see a red streak or line that extends up or down from the affected area.  You have skin breakdown or a loss of skin in the affected area, even if  the breakdown is small.  You get an injury in the affected area. Get help right away if:  You get an injury and an open wound in the affected area.  You have severe pain that does not get better with medicine.  You have sudden numbness or weakness in the foot or ankle below the affected area, or you have trouble moving your foot or ankle.  You have a fever and you have worse or persistent symptoms.  You have chest pain.  You have shortness of breath. Summary  Chronic venous insufficiency, also called venous stasis, is a condition that prevents blood from being pumped effectively through the veins in your legs.  Chronic venous insufficiency occurs when the vein walls become stretched, weakened, or damaged, or when valves within the vein are damaged.  Treatment for this condition depends on how severe your condition is, and it may involve wearing compression stockings or having a procedure.  Make sure you stay active by exercising, walking, or doing different activities. Ask your health care provider what activities are safe for you and how much exercise you need. This information is not intended to replace advice given to you by your health care provider. Make sure you discuss any questions you have with your health care provider. Document Released: 09/24/2006 Document Revised: 04/09/2016 Document Reviewed: 04/09/2016 Elsevier Interactive Patient Education  2017 [employer].

## 2017-11-07 NOTE — Assessment & Plan Note (Addendum)
Cholelithiasis with confluence of gallstones measuring 2.5 cm in length, similar to prior study. No gallbladder wall thickening or pericholecystic fluid Surg ref when [name] is ready

## 2017-11-07 NOTE — Assessment & Plan Note (Signed)
Support socks knee high Elevate legs Low salt diet Loose 10-20 lbs

## 2017-11-07 NOTE — Assessment & Plan Note (Signed)
Labs Furosemide/KCl prn

## 2017-11-07 NOTE — Progress Notes (Signed)
Subjective:  Patient ID: Kristy Keller, female    DOB: 1960/12/01  Age: 57 y.o. MRN: [id number]  CC: No chief complaint on file.   HPI Kristy Keller presents for LE swelling. Using a lot of salt, put wt on Gabapentin caused swelling - stopped 2 weeks ago F/u LBP C/o B hip pain  Outpatient Medications Prior to Visit  Medication Sig Dispense Refill  . acetaminophen (TYLENOL) 500 MG tablet Take 500 mg by mouth every 5 (five) hours as needed.    Marland Kitchen aspirin EC 81 MG tablet Take 81 mg by mouth daily.    . Cholecalciferol (VITAMIN D3) 2000 units capsule Take 1 capsule (2,000 Units total) by mouth daily. 100 capsule 3  . doxycycline (VIBRA-TABS) 100 MG tablet Take 1 tablet (100 mg total) by mouth 2 (two) times daily. 14 tablet 0  . estrogen, conjugated,-medroxyprogesterone (PREMPRO) 0.3-1.5 MG tablet Take by mouth.    . mupirocin ointment (BACTROBAN) 2 % qd 15 g 0   No facility-administered medications prior to visit.     ROS: Review of Systems  Constitutional: Negative for activity change, appetite change, chills, fatigue and unexpected weight change.  HENT: Negative for congestion, mouth sores and sinus pressure.   Eyes: Negative for visual disturbance.  Respiratory: Negative for cough and chest tightness.   Gastrointestinal: Negative for abdominal pain and nausea.  Genitourinary: Negative for difficulty urinating, frequency and vaginal pain.  Musculoskeletal: Positive for back pain. Negative for gait problem.  Skin: Negative for pallor and rash.  Neurological: Negative for dizziness, tremors, weakness, numbness and headaches.  Psychiatric/Behavioral: Negative for confusion and sleep disturbance.    Objective:  BP (!) 142/94 (BP Location: Left Arm, Patient Position: Sitting, Cuff Size: Large)   Pulse 69   Temp 98.9 F (37.2 C) (Oral)   Ht 5' 5.75" (1.67 m)   Wt 212 lb (96.2 kg)   SpO2 98%   BMI 34.48 kg/m   BP Readings from Last 3 Encounters:  11/07/17 (!)  142/94  07/19/17 138/84  06/05/17 130/84    Wt Readings from Last 3 Encounters:  11/07/17 212 lb (96.2 kg)  07/19/17 208 lb (94.3 kg)  06/05/17 201 lb (91.2 kg)    Physical Exam  Constitutional: She appears well-developed. No distress.  HENT:  Head: Normocephalic.  Right Ear: External ear normal.  Left Ear: External ear normal.  Nose: Nose normal.  Mouth/Throat: Oropharynx is clear and moist.  Eyes: Pupils are equal, round, and reactive to light. Conjunctivae are normal. Right eye exhibits no discharge. Left eye exhibits no discharge.  Neck: Normal range of motion. Neck supple. No JVD present. No tracheal deviation present. No thyromegaly present.  Cardiovascular: Normal rate, regular rhythm and normal heart sounds.  Pulmonary/Chest: No stridor. No respiratory distress. She has no wheezes.  Abdominal: Soft. Bowel sounds are normal. She exhibits no distension and no mass. There is no tenderness. There is no rebound and no guarding.  Musculoskeletal: She exhibits edema and tenderness.  Lymphadenopathy:    She has no cervical adenopathy.  Neurological: She displays normal reflexes. No cranial nerve deficit. She exhibits normal muscle tone. Coordination normal.  Skin: No rash noted. No erythema.  Psychiatric: She has a normal mood and affect. Her behavior is normal. Judgment and thought content normal.  edema 1+ B, NT LS tender  Lab Results  Component Value Date   WBC 4.5 03/07/2017   HGB 14.6 03/07/2017   HCT 43.8 03/07/2017   PLT 271.0 03/07/2017  GLUCOSE 84 03/07/2017   CHOL 133 03/07/2017   TRIG 73.0 03/07/2017   HDL 47.70 03/07/2017   LDLCALC 71 03/07/2017   ALT 34 03/07/2017   AST 21 03/07/2017   NA 143 03/07/2017   K 4.3 03/07/2017   CL 105 03/07/2017   CREATININE 0.99 03/07/2017   BUN 10 03/07/2017   CO2 32 03/07/2017   TSH 0.31 (L) 03/07/2017   INR 1.1 (H) 03/07/2017    US Abdomen Complete  Result Date: 03/27/2017 CLINICAL DATA:  Previous  cholelithiasis EXAM: ABDOMEN ULTRASOUND COMPLETE COMPARISON:  November 03, 2012 FINDINGS: Gallbladder: Within the gallbladder, there are several confluent echogenic foci which move and shadow consistent with cholelithiasis. The confluence of gallstones measures 2.5 cm in length, similar to prior study. There is no appreciable gallbladder wall thickening or pericholecystic fluid. No sonographic Murphy sign noted by sonographer. Common bile duct: Diameter: 4 mm. No intrahepatic, common hepatic, or common bile duct dilatation. Liver: There is a cyst in the posterior segment right lobe of the liver near the hepatorenal fossa measuring 2.3 x 2.0 x 2.4 cm. No other focal liver lesion is evident. Liver echogenicity overall is increased. Portal vein is patent on color Doppler imaging with normal direction of blood flow towards the liver. IVC: No abnormality visualized. Pancreas: Visualized portion unremarkable. Portions of pancreas obscured by gas. Spleen: Size and appearance within normal limits. Right Kidney: Length: 10.5 cm. Echogenicity within normal limits. No mass or hydronephrosis visualized. Left Kidney: Length: 12.3 cm. Echogenicity within normal limits. No mass or hydronephrosis visualized. Abdominal aorta: No aneurysm visualized. Other findings: No demonstrable ascites. IMPRESSION: 1. Cholelithiasis with confluence of gallstones measuring 2.5 cm in length, similar to prior study. No gallbladder wall thickening or pericholecystic fluid. 2. Cyst in right lobe of liver. No other focal liver lesions are identified. Increased liver echogenicity most likely is indicative of hepatic steatosis. It should be noted that the sensitivity of ultrasound for detection of noncystic liver lesions is diminished in this circumstance. 3. Portions of pancreas obscured by gas. Visualized portions of pancreas appear normal. 4.  Study otherwise unremarkable. Electronically Signed   By: Lowella Grip III M.D.   On: 03/27/2017 11:16     Assessment & Plan:   There are no diagnoses linked to this encounter.   No orders of the defined types were placed in this encounter.    Follow-up: No follow-ups on file.  Walker Kehr, MD

## 2017-11-21 ENCOUNTER — Ambulatory Visit: Payer: Self-pay | Admitting: *Deleted

## 2017-11-21 NOTE — Telephone Encounter (Signed)
Pt reports took lasix past weekend as ordered PRN. First time taking med which was RX 11/07/17, edema,  'chronic venous insufficiency.'  States 1 1/2 hours after taking hands and wrists with severe itching, bright red diffuse rash breasts, stomach. States took benadryl, several doses  and symptoms resolved. Took lasix again yesterday and rash, itching returned; again resolved with benadryl. States she "forgot to take the K+ the first time, did take with second lasix dose". Last night developed several hives "Size of a quarter" on cheeks, neck. States took benadryl at 2200, hives nor rash present this AM. Only "mild" itching of hands, between fingers. Pt states lasix did help with edema.  Denies any SOB, dysphagia, oral swelling, speech clear. Pt requesting alternative diuretic called in to pharmacy.  Pt made aware she may need to be seen. Care advise given per protocol. Instructed to go to ED of symptoms worsen, swelling, SOB. Please advise: [phone]  Reason for Disposition . [1] Widespread hives, itching or facial swelling AND [2] onset > 2 hours after exposure to high-risk allergen (e.g., sting, nuts, 1st dose of antibiotic)    Occurred x 2 after taking Lasix. Takes PRN. Not present now.  Answer Assessment - Initial Assessment Questions 1. APPEARANCE: "What does the rash look like?"      Bright red, some hives last night face, neck, not presently 2. LOCATION: "Where is the rash located?"      Arms, breasts, stomach, face, neck last night 3. NUMBER: "How many hives are there?"      Multiple on face, 1 on neck HS, not presently 4. SIZE: "How big are the hives?" (inches, cm, compare to coins) "Do they all look the same or is there lots of variation in shape and size?"      quarter 5. ONSET: "When did the hives begin?" (Hours or days ago)      Past weekend, then resolved with benadryl, occurred again last night. Occurring after taking lasix. 6. ITCHING: "Does it itch?" If so, ask: "How bad is the  itch?"    - MILD: doesn't interfere with normal activities   - MODERATE-SEVERE: interferes with work, school, sleep, or other activities      Severe, mild this am only between fingers 7. RECURRENT PROBLEM: "Have you had hives before?" If so, ask: "When was the last time?" and "What happened that time?"      no 8. TRIGGERS: "Were you exposed to any new food, plant, cosmetic product or animal just before the hives began?"     Took lasix x 2. Weekend, and yesterday. 9. OTHER SYMPTOMS: "Do you have any other symptoms?" (e.g., fever, tongue swelling, difficulty breathing, abdominal pain)     No  Protocols used: HIVES-A-AH

## 2017-11-21 NOTE — Telephone Encounter (Signed)
Please advise 

## 2017-11-22 ENCOUNTER — Ambulatory Visit: Payer: Self-pay

## 2017-11-22 ENCOUNTER — Ambulatory Visit: Payer: BLUE CROSS/BLUE SHIELD | Admitting: Family

## 2017-11-22 ENCOUNTER — Encounter: Payer: Self-pay | Admitting: Family

## 2017-11-22 VITALS — BP 140/82 | HR 96 | Temp 98.5°F | Ht 65.75 in | Wt 213.0 lb

## 2017-11-22 DIAGNOSIS — L509 Urticaria, unspecified: Secondary | ICD-10-CM

## 2017-11-22 MED ORDER — EPINEPHRINE 0.3 MG/0.3ML IJ SOAJ: 0.3000 mg | Freq: Once | INTRAMUSCULAR | 0 refills | Status: AC

## 2017-11-22 MED ORDER — METHYLPREDNISOLONE ACETATE 40 MG/ML IJ SUSP
40.0000 mg | Freq: Once | INTRAMUSCULAR | Status: AC
  Administered 2017-11-22: 40 mg via INTRAMUSCULAR

## 2017-11-22 MED ORDER — METHYLPREDNISOLONE 4 MG PO TBPK: ORAL_TABLET | ORAL | 0 refills | Status: DC

## 2017-11-22 NOTE — Addendum Note (Signed)
Addended by: Marcina Millard on: 11/22/2017 09:33 AM   Modules accepted: Orders

## 2017-11-22 NOTE — Telephone Encounter (Signed)
Left mess for patient to call back to ask about below.

## 2017-11-22 NOTE — Telephone Encounter (Addendum)
Pt. Reports having hives and itching "on and off" x 1 week. States she thinks her Lasix started this. Did not take any yesterday. States she breaks out "mainly in the evening." States she will break out on her face, breast, back and bottoms of feet. Has intense itching when hives are present. No hives or itching this morning. "I have taken a whole box of Benadryl this week." Appointment made for this morning . Reason for Disposition . [1] MODERATE-SEVERE hives persist (i.e., hives interfere with normal activities or work) AND [2] taking antihistamine (e.g., Benadryl, Claritin) > 24 hours  Answer Assessment - Initial Assessment Questions 1. APPEARANCE: "What does the rash look like?"      No rash now or hives 2. LOCATION: "Where is the rash located?"      It was under the breast, on back and shoulder, last night - bottoms of feet 3. NUMBER: "How many hives are there?"      Had about 4-5 4. SIZE: "How big are the hives?" (inches, cm, compare to coins) "Do they all look the same or is there lots of variation in shape and size?"      Varies 5. ONSET: "When did the hives begin?" (Hours or days ago)      Started 1 week ago 6. ITCHING: "Does it itch?" If so, ask: "How bad is the itch?"    - MILD: doesn't interfere with normal activities   - MODERATE-SEVERE: interferes with work, school, sleep, or other activities      Severe 7. RECURRENT PROBLEM: "Have you had hives before?" If so, ask: "When was the last time?" and "What happened that time?"      No 8. TRIGGERS: "Were you exposed to any new food, plant, cosmetic product or animal just before the hives began?"     Maybe my Lasix 9. OTHER SYMPTOMS: "Do you have any other symptoms?" (e.g., fever, tongue swelling, difficulty breathing, abdominal pain)     No 10. PREGNANCY: "Is there any chance you are pregnant?" "When was your last menstrual period?"       No  Protocols used: HIVES-A-AH

## 2017-11-22 NOTE — Telephone Encounter (Signed)
Is she taking Gabapentin? Is she taking hormones? Any new meds? Thx

## 2017-11-22 NOTE — Progress Notes (Signed)
Kristy Keller is a 57 y.o. female with the following history as recorded in [employer]:  Patient Active Problem List   Diagnosis Date Noted  . Chronic venous insufficiency 11/07/2017  . Infected sebaceous cyst 07/19/2017  . Edema 06/05/2017  . Radiculopathy of leg 03/06/2017  . Preop exam for internal medicine 03/05/2017  . Gallstone 03/05/2017  . Family history of early CAD 03/05/2017    Current Outpatient Medications  Medication Sig Dispense Refill  . acetaminophen (TYLENOL) 500 MG tablet Take 500 mg by mouth every 5 (five) hours as needed.    . Cholecalciferol (VITAMIN D3) 2000 units capsule Take 1 capsule (2,000 Units total) by mouth daily. 100 capsule 3  . estradiol (ESTRACE) 1 MG tablet Take 1 mg by mouth daily.  3  . estrogen, conjugated,-medroxyprogesterone (PREMPRO) 0.3-1.5 MG tablet Take by mouth.    . mupirocin ointment (BACTROBAN) 2 % qd 15 g 0  . potassium chloride (KLOR-CON 10) 10 MEQ tablet Take 1 tablet (10 mEq total) by mouth daily. 30 tablet 5  . EPINEPHrine 0.3 mg/0.3 mL IJ SOAJ injection Inject 0.3 mLs (0.3 mg total) into the muscle once for 1 dose. 1 Device 0  . methylPREDNISolone (MEDROL DOSEPAK) 4 MG TBPK tablet Taper as directed 21 tablet 0   No current facility-administered medications for this visit.     Allergies: Gabapentin and Lasix [furosemide]  Past Medical History:  Diagnosis Date  . Chicken pox   . Gallstones     Past Surgical History:  Procedure Laterality Date  . ABDOMINAL HYSTERECTOMY     partial    Family History  Problem Relation Age of Onset  . Heart disease Mother 28       CHF, CAD  . Cancer Father 58       lung ca  . Diabetes Sister   . Heart disease Brother 7       CAD, CABG    Social History   Tobacco Use  . Smoking status: Former Research scientist (life sciences)  . Smokeless tobacco: Never Used  Substance Use Topics  . Alcohol use: No    Subjective:  Patient presents with concerns for recurrent hives "on and off" for the past week; was  started on Lasix approximately 2 weeks ago- patient is suspicious that the medication is the source; she noted that she felt "pins and needles" and itching in her hands/ feet almost immediately after taking the first tablet; denies any shortness of breath or difficulty breathing but has had some swelling in her and around her face/ mouth.  Took a Benadryl last night and has gotten good relief; did not take any Lasix yesterday and has no symptoms today;   Objective:  Vitals:   11/22/17 0810  BP: 140/82  Pulse: 96  Temp: 98.5 F (36.9 C)  TempSrc: Oral  SpO2: 97%  Weight: 213 lb (96.6 kg)  Height: 5' 5.75" (1.67 m)    General: Well developed, well nourished, in no acute distress  Skin : Warm and dry.  Head: Normocephalic and atraumatic  Lungs: Respirations unlabored; clear to auscultation bilaterally without wheeze, rales, rhonchi  Neurologic: Alert and oriented; speech intact; face symmetrical; moves all extremities well; CNII-XII intact without focal deficit   Assessment:  1. Urticaria     Plan:  Most likely due to Lasix; ( of note, patient's father could not tolerate generic Lasix and had to take branded Lasix); agree with decision to stop Lasix- she will discuss chronic swelling with her MD at follow-up  next month; Depo-Medrol IM 40 mg given today; Rx for Medrol Dose Pak- take as directed; follow-up immediately if the symptoms return; she is also given Rx for Epi-Pen in case there is another trigger for the hives that has not been determined yet. May need to refer to allergist if symptoms persist.   No follow-ups on file.  No orders of the defined types were placed in this encounter.   Requested Prescriptions   Signed Prescriptions Disp Refills  . methylPREDNISolone (MEDROL DOSEPAK) 4 MG TBPK tablet 21 tablet 0    Sig: Taper as directed  . EPINEPHrine 0.3 mg/0.3 mL IJ SOAJ injection 1 Device 0    Sig: Inject 0.3 mLs (0.3 mg total) into the muscle once for 1 dose.

## 2017-12-27 ENCOUNTER — Ambulatory Visit: Payer: BLUE CROSS/BLUE SHIELD | Admitting: Internal Medicine

## 2017-12-30 ENCOUNTER — Ambulatory Visit: Payer: BLUE CROSS/BLUE SHIELD | Admitting: Internal Medicine

## 2017-12-30 ENCOUNTER — Encounter: Payer: Self-pay | Admitting: Internal Medicine

## 2017-12-30 VITALS — BP 134/86 | HR 78 | Temp 98.6°F | Ht 65.75 in | Wt 219.0 lb

## 2017-12-30 DIAGNOSIS — M5410 Radiculopathy, site unspecified: Secondary | ICD-10-CM

## 2017-12-30 DIAGNOSIS — G4700 Insomnia, unspecified: Secondary | ICD-10-CM

## 2017-12-30 DIAGNOSIS — I872 Venous insufficiency (chronic) (peripheral): Secondary | ICD-10-CM

## 2017-12-30 DIAGNOSIS — F5101 Primary insomnia: Secondary | ICD-10-CM

## 2017-12-30 DIAGNOSIS — R609 Edema, unspecified: Secondary | ICD-10-CM

## 2017-12-30 HISTORY — DX: Insomnia, unspecified: G47.00

## 2017-12-30 MED ORDER — SPIRONOLACTONE 25 MG PO TABS: 25.0000 mg | ORAL_TABLET | Freq: Every day | ORAL | 11 refills | Status: DC

## 2017-12-30 MED ORDER — ZOLPIDEM TARTRATE 5 MG PO TABS: 5.0000 mg | ORAL_TABLET | Freq: Every evening | ORAL | 2 refills | Status: DC | PRN

## 2017-12-30 NOTE — Patient Instructions (Addendum)
Stat Spironolactone

## 2017-12-30 NOTE — Progress Notes (Signed)
Subjective:  Patient ID: Kristy Keller, female    DOB: 07-Dec-1960  Age: 57 y.o. MRN: [id number]  CC: No chief complaint on file.   HPI Kristy Keller presents for leg swelling, hives from Lasix after 10 d of treatment. Can't sleep at all  Outpatient Medications Prior to Visit  Medication Sig Dispense Refill  . acetaminophen (TYLENOL) 500 MG tablet Take 500 mg by mouth every 5 (five) hours as needed.    . Cholecalciferol (VITAMIN D3) 2000 units capsule Take 1 capsule (2,000 Units total) by mouth daily. 100 capsule 3  . estradiol (ESTRACE) 1 MG tablet Take 1 mg by mouth daily.  3  . Multiple Vitamins-Minerals (ALIVE ONCE DAILY WOMENS PO) Take by mouth.    . estrogen, conjugated,-medroxyprogesterone (PREMPRO) 0.3-1.5 MG tablet Take by mouth.    . methylPREDNISolone (MEDROL DOSEPAK) 4 MG TBPK tablet Taper as directed 21 tablet 0  . mupirocin ointment (BACTROBAN) 2 % qd 15 g 0  . potassium chloride (KLOR-CON 10) 10 MEQ tablet Take 1 tablet (10 mEq total) by mouth daily. 30 tablet 5   No facility-administered medications prior to visit.     ROS: Review of Systems  Constitutional: Negative for activity change, appetite change, chills, fatigue and unexpected weight change.  HENT: Negative for congestion, mouth sores and sinus pressure.   Eyes: Negative for visual disturbance.  Respiratory: Negative for cough and chest tightness.   Cardiovascular: Positive for leg swelling.  Gastrointestinal: Negative for abdominal pain and nausea.  Genitourinary: Negative for difficulty urinating, frequency and vaginal pain.  Musculoskeletal: Positive for back pain and gait problem.  Skin: Negative for pallor and rash.  Neurological: Negative for dizziness, tremors, weakness, numbness and headaches.  Psychiatric/Behavioral: Negative for confusion, sleep disturbance and suicidal ideas.    Objective:  BP 134/86 (BP Location: Left Arm, Patient Position: Sitting, Cuff Size: Large)   Pulse 78    Temp 98.6 F (37 C) (Oral)   Ht 5' 5.75" (1.67 m)   Wt 219 lb (99.3 kg)   SpO2 98%   BMI 35.62 kg/m   BP Readings from Last 3 Encounters:  12/30/17 134/86  11/22/17 140/82  11/07/17 (!) 142/94    Wt Readings from Last 3 Encounters:  12/30/17 219 lb (99.3 kg)  11/22/17 213 lb (96.6 kg)  11/07/17 212 lb (96.2 kg)    Physical Exam  Constitutional: She appears well-developed. No distress.  HENT:  Head: Normocephalic.  Right Ear: External ear normal.  Left Ear: External ear normal.  Nose: Nose normal.  Mouth/Throat: Oropharynx is clear and moist.  Eyes: Pupils are equal, round, and reactive to light. Conjunctivae are normal. Right eye exhibits no discharge. Left eye exhibits no discharge.  Neck: Normal range of motion. Neck supple. No JVD present. No tracheal deviation present. No thyromegaly present.  Cardiovascular: Normal rate, regular rhythm and normal heart sounds.  Pulmonary/Chest: No stridor. No respiratory distress. She has no wheezes.  Abdominal: Soft. Bowel sounds are normal. She exhibits no distension and no mass. There is no tenderness. There is no rebound and no guarding.  Musculoskeletal: She exhibits no edema or tenderness.  Lymphadenopathy:    She has no cervical adenopathy.  Neurological: She displays normal reflexes. No cranial nerve deficit. She exhibits normal muscle tone. Coordination normal.  Skin: No rash noted. No erythema.  Psychiatric: She has a normal mood and affect. Her behavior is normal. Judgment and thought content normal.  LE edema 1+  B feet are purplish  Lab Results  Component Value Date   WBC 4.5 03/07/2017   HGB 14.6 03/07/2017   HCT 43.8 03/07/2017   PLT 271.0 03/07/2017   GLUCOSE 90 11/07/2017   CHOL 133 03/07/2017   TRIG 73.0 03/07/2017   HDL 47.70 03/07/2017   LDLCALC 71 03/07/2017   ALT 34 03/07/2017   AST 21 03/07/2017   NA 139 11/07/2017   K 4.1 11/07/2017   CL 102 11/07/2017   CREATININE 0.94 11/07/2017   BUN 13  11/07/2017   CO2 30 11/07/2017   TSH 0.81 11/07/2017   INR 1.1 (H) 03/07/2017    US Abdomen Complete  Result Date: 03/27/2017 CLINICAL DATA:  Previous cholelithiasis EXAM: ABDOMEN ULTRASOUND COMPLETE COMPARISON:  November 03, 2012 FINDINGS: Gallbladder: Within the gallbladder, there are several confluent echogenic foci which move and shadow consistent with cholelithiasis. The confluence of gallstones measures 2.5 cm in length, similar to prior study. There is no appreciable gallbladder wall thickening or pericholecystic fluid. No sonographic Murphy sign noted by sonographer. Common bile duct: Diameter: 4 mm. No intrahepatic, common hepatic, or common bile duct dilatation. Liver: There is a cyst in the posterior segment right lobe of the liver near the hepatorenal fossa measuring 2.3 x 2.0 x 2.4 cm. No other focal liver lesion is evident. Liver echogenicity overall is increased. Portal vein is patent on color Doppler imaging with normal direction of blood flow towards the liver. IVC: No abnormality visualized. Pancreas: Visualized portion unremarkable. Portions of pancreas obscured by gas. Spleen: Size and appearance within normal limits. Right Kidney: Length: 10.5 cm. Echogenicity within normal limits. No mass or hydronephrosis visualized. Left Kidney: Length: 12.3 cm. Echogenicity within normal limits. No mass or hydronephrosis visualized. Abdominal aorta: No aneurysm visualized. Other findings: No demonstrable ascites. IMPRESSION: 1. Cholelithiasis with confluence of gallstones measuring 2.5 cm in length, similar to prior study. No gallbladder wall thickening or pericholecystic fluid. 2. Cyst in right lobe of liver. No other focal liver lesions are identified. Increased liver echogenicity most likely is indicative of hepatic steatosis. It should be noted that the sensitivity of ultrasound for detection of noncystic liver lesions is diminished in this circumstance. 3. Portions of pancreas obscured by gas.  Visualized portions of pancreas appear normal. 4.  Study otherwise unremarkable. Electronically Signed   By: Lowella Grip III M.D.   On: 03/27/2017 11:16    Assessment & Plan:   There are no diagnoses linked to this encounter.   No orders of the defined types were placed in this encounter.    Follow-up: No follow-ups on file.  Walker Kehr, MD

## 2017-12-30 NOTE — Assessment & Plan Note (Signed)
Spironolactone Ven Doppler

## 2017-12-30 NOTE — Assessment & Plan Note (Signed)
Relapsed Ven doppler US Spironolactone

## 2017-12-30 NOTE — Assessment & Plan Note (Signed)
Tylenol prn 

## 2017-12-30 NOTE — Assessment & Plan Note (Signed)
Ambien  Potential benefits of a long term benzodiazepines  use as well as potential risks  and complications were explained to the patient and were aknowledged.

## 2018-01-02 ENCOUNTER — Ambulatory Visit (HOSPITAL_COMMUNITY)
Admission: RE | Admit: 2018-01-02 | Discharge: 2018-01-02 | Disposition: A | Discharge status: Home / Self Care | Payer: BLUE CROSS/BLUE SHIELD | Source: Ambulatory Visit | Attending: Vascular Surgery | Admitting: Vascular Surgery

## 2018-01-02 ENCOUNTER — Telehealth: Payer: Self-pay | Admitting: Internal Medicine

## 2018-01-02 DIAGNOSIS — I872 Venous insufficiency (chronic) (peripheral): Secondary | ICD-10-CM | POA: Insufficient documentation

## 2018-01-02 DIAGNOSIS — R609 Edema, unspecified: Secondary | ICD-10-CM | POA: Insufficient documentation

## 2018-01-02 NOTE — Telephone Encounter (Signed)
Routing to dr plotnikov, fyi.... 

## 2018-01-02 NOTE — Telephone Encounter (Signed)
Kristy Keller called from [facility]:   Negative for DVT Positive Deep and superficial vein reflux

## 2018-01-02 NOTE — Telephone Encounter (Signed)
Kristy Keller, Please inform the patient that her venous Doppler ultrasound shows chronic venous insufficiency in both legs.  There is no blood clot in either leg.  Plan-as we discussed.  [name] should try to lose 10-20 pounds as soon as possible. Thanks, AP

## 2018-01-07 NOTE — Telephone Encounter (Signed)
Pt.notified

## 2018-02-04 ENCOUNTER — Ambulatory Visit: Payer: BLUE CROSS/BLUE SHIELD | Admitting: Family Medicine

## 2018-02-11 ENCOUNTER — Ambulatory Visit: Payer: BLUE CROSS/BLUE SHIELD | Admitting: Internal Medicine

## 2018-02-11 ENCOUNTER — Encounter: Payer: Self-pay | Admitting: Internal Medicine

## 2018-02-11 DIAGNOSIS — M5410 Radiculopathy, site unspecified: Secondary | ICD-10-CM | POA: Diagnosis not present

## 2018-02-11 DIAGNOSIS — I872 Venous insufficiency (chronic) (peripheral): Secondary | ICD-10-CM

## 2018-02-11 DIAGNOSIS — R609 Edema, unspecified: Secondary | ICD-10-CM

## 2018-02-11 DIAGNOSIS — F5101 Primary insomnia: Secondary | ICD-10-CM

## 2018-02-11 NOTE — Assessment & Plan Note (Signed)
Zolpidem prn  Potential benefits of a long term benzodiazepines  use as well as potential risks  and complications were explained to the patient and were aknowledged. 

## 2018-02-11 NOTE — Progress Notes (Signed)
Subjective:  Patient ID: Kristy Keller, female    DOB: 06-18-1960  Age: 57 y.o. MRN: [id number]  CC: No chief complaint on file.   HPI Kristy Keller presents for LBP, leg swelling, wt gain. Not moving, eating "junk"   Outpatient Medications Prior to Visit  Medication Sig Dispense Refill  . acetaminophen (TYLENOL) 500 MG tablet Take 500 mg by mouth every 5 (five) hours as needed.    . Cholecalciferol (VITAMIN D3) 2000 units capsule Take 1 capsule (2,000 Units total) by mouth daily. 100 capsule 3  . estradiol (ESTRACE) 1 MG tablet Take 1 mg by mouth daily.  3  . Multiple Vitamins-Minerals (ALIVE ONCE DAILY WOMENS PO) Take by mouth.    . spironolactone (ALDACTONE) 25 MG tablet Take 1 tablet (25 mg total) by mouth daily. 30 tablet 11  . zolpidem (AMBIEN) 5 MG tablet Take 1 tablet (5 mg total) by mouth at bedtime as needed for sleep. 30 tablet 2   No facility-administered medications prior to visit.     ROS: Review of Systems  Constitutional: Positive for unexpected weight change. Negative for activity change, appetite change, chills and fatigue.  HENT: Negative for congestion, mouth sores and sinus pressure.   Eyes: Negative for visual disturbance.  Respiratory: Negative for cough and chest tightness.   Cardiovascular: Positive for leg swelling.  Gastrointestinal: Negative for abdominal pain and nausea.  Genitourinary: Negative for difficulty urinating, frequency and vaginal pain.  Musculoskeletal: Positive for back pain. Negative for gait problem.  Skin: Negative for pallor and rash.  Neurological: Negative for dizziness, tremors, weakness, numbness and headaches.  Psychiatric/Behavioral: Negative for confusion, sleep disturbance and suicidal ideas.    Objective:  There were no vitals taken for this visit.  BP Readings from Last 3 Encounters:  12/30/17 134/86  11/22/17 140/82  11/07/17 (!) 142/94    Wt Readings from Last 3 Encounters:  12/30/17 219 lb (99.3  kg)  11/22/17 213 lb (96.6 kg)  11/07/17 212 lb (96.2 kg)    Physical Exam  Constitutional: She appears well-developed. No distress.  HENT:  Head: Normocephalic.  Right Ear: External ear normal.  Left Ear: External ear normal.  Nose: Nose normal.  Mouth/Throat: Oropharynx is clear and moist.  Eyes: Pupils are equal, round, and reactive to light. Conjunctivae are normal. Right eye exhibits no discharge. Left eye exhibits no discharge.  Neck: Normal range of motion. Neck supple. No JVD present. No tracheal deviation present. No thyromegaly present.  Cardiovascular: Normal rate, regular rhythm and normal heart sounds.  Pulmonary/Chest: No stridor. No respiratory distress. She has no wheezes.  Abdominal: Soft. Bowel sounds are normal. She exhibits no distension and no mass. There is no tenderness. There is no rebound and no guarding.  Musculoskeletal: She exhibits tenderness. She exhibits no edema.  Lymphadenopathy:    She has no cervical adenopathy.  Neurological: She displays normal reflexes. No cranial nerve deficit. She exhibits normal muscle tone. Coordination normal.  Skin: No rash noted. No erythema.  Psychiatric: She has a normal mood and affect. Her behavior is normal. Judgment and thought content normal.  trace edema B Limping a little Decreased ROM LS back  Lab Results  Component Value Date   WBC 4.5 03/07/2017   HGB 14.6 03/07/2017   HCT 43.8 03/07/2017   PLT 271.0 03/07/2017   GLUCOSE 90 11/07/2017   CHOL 133 03/07/2017   TRIG 73.0 03/07/2017   HDL 47.70 03/07/2017   LDLCALC 71 03/07/2017   ALT 34  03/07/2017   AST 21 03/07/2017   NA 139 11/07/2017   K 4.1 11/07/2017   CL 102 11/07/2017   CREATININE 0.94 11/07/2017   BUN 13 11/07/2017   CO2 30 11/07/2017   TSH 0.81 11/07/2017   INR 1.1 (H) 03/07/2017    No results found.  Assessment & Plan:   There are no diagnoses linked to this encounter.   No orders of the defined types were placed in this  encounter.    Follow-up: No follow-ups on file.  Walker Kehr, MD

## 2018-02-11 NOTE — Assessment & Plan Note (Signed)
Spironolactone Cont w/wt loss effort

## 2018-02-11 NOTE — Patient Instructions (Signed)
Loose weight Join a gentle yoga class

## 2018-02-11 NOTE — Assessment & Plan Note (Addendum)
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 LBP and RLE pain is better Flexeril prn Loose weight Join a gentle yoga class PT is too $$$

## 2018-02-21 ENCOUNTER — Other Ambulatory Visit: Payer: Self-pay | Admitting: Orthopedic Surgery

## 2018-02-21 DIAGNOSIS — M4326 Fusion of spine, lumbar region: Secondary | ICD-10-CM

## 2018-02-21 DIAGNOSIS — K829 Disease of gallbladder, unspecified: Secondary | ICD-10-CM | POA: Insufficient documentation

## 2018-02-21 DIAGNOSIS — N63 Unspecified lump in unspecified breast: Secondary | ICD-10-CM | POA: Insufficient documentation

## 2018-02-21 HISTORY — DX: Unspecified lump in unspecified breast: N63.0

## 2018-02-21 HISTORY — DX: Disease of gallbladder, unspecified: K82.9

## 2018-03-07 ENCOUNTER — Ambulatory Visit
Admission: RE | Admit: 2018-03-07 | Discharge: 2018-03-07 | Disposition: A | Discharge status: Home / Self Care | Payer: BLUE CROSS/BLUE SHIELD | Source: Ambulatory Visit | Attending: Orthopedic Surgery | Admitting: Orthopedic Surgery

## 2018-03-07 VITALS — BP 121/63 | HR 84

## 2018-03-07 DIAGNOSIS — M5410 Radiculopathy, site unspecified: Secondary | ICD-10-CM

## 2018-03-07 DIAGNOSIS — M4326 Fusion of spine, lumbar region: Secondary | ICD-10-CM

## 2018-03-07 MED ORDER — DIAZEPAM 5 MG PO TABS
10.0000 mg | ORAL_TABLET | Freq: Once | ORAL | Status: AC
  Administered 2018-03-07: 10 mg via ORAL

## 2018-03-07 MED ORDER — IOPAMIDOL (ISOVUE-M 200) INJECTION 41%
15.0000 mL | Freq: Once | INTRAMUSCULAR | Status: AC
  Administered 2018-03-07: 15 mL via INTRATHECAL

## 2018-03-07 MED ORDER — HYDROCODONE-ACETAMINOPHEN 5-325 MG PO TABS
1.0000 | ORAL_TABLET | Freq: Once | ORAL | Status: AC
  Administered 2018-03-07: 1 via ORAL

## 2018-03-07 NOTE — Discharge Instructions (Signed)

## 2018-05-14 ENCOUNTER — Ambulatory Visit: Payer: BLUE CROSS/BLUE SHIELD | Admitting: Internal Medicine

## 2018-05-14 ENCOUNTER — Encounter: Payer: Self-pay | Admitting: Internal Medicine

## 2018-05-14 DIAGNOSIS — R609 Edema, unspecified: Secondary | ICD-10-CM | POA: Diagnosis not present

## 2018-05-14 DIAGNOSIS — M5410 Radiculopathy, site unspecified: Secondary | ICD-10-CM

## 2018-05-14 DIAGNOSIS — F5101 Primary insomnia: Secondary | ICD-10-CM

## 2018-05-14 DIAGNOSIS — I872 Venous insufficiency (chronic) (peripheral): Secondary | ICD-10-CM

## 2018-05-14 MED ORDER — SPIRONOLACTONE 25 MG PO TABS: 25.0000 mg | ORAL_TABLET | Freq: Every day | ORAL | 3 refills | Status: DC

## 2018-05-14 NOTE — Assessment & Plan Note (Addendum)
Resolved  W/Spironolactone Loose wt

## 2018-05-14 NOTE — Assessment & Plan Note (Signed)
Better  Loose wt Tylenol prn

## 2018-05-14 NOTE — Assessment & Plan Note (Signed)
Not taking Zolpidem

## 2018-05-14 NOTE — Assessment & Plan Note (Signed)
Better w/Spironolactone

## 2018-05-14 NOTE — Progress Notes (Signed)
Subjective:  Patient ID: Kristy Keller, female    DOB: [birth year]  Age: 57 y.o. MRN: [id number]  CC: No chief complaint on file.   HPI KAROLYNN INFANTINO presents for LBP, insomnia, edema - resolved. She will need to switch PCP due to insurance change  Outpatient Medications Prior to Visit  Medication Sig Dispense Refill  . acetaminophen (TYLENOL) 500 MG tablet Take 500 mg by mouth every 5 (five) hours as needed.    . Cholecalciferol (VITAMIN D3) 2000 units capsule Take 1 capsule (2,000 Units total) by mouth daily. 100 capsule 3  . cyclobenzaprine (FLEXERIL) 10 MG tablet Take 10 mg by mouth 3 (three) times daily as needed for muscle spasms.    Marland Kitchen estradiol (ESTRACE) 1 MG tablet Take 1 mg by mouth daily.  3  . Multiple Vitamins-Minerals (ALIVE ONCE DAILY WOMENS PO) Take by mouth.    . spironolactone (ALDACTONE) 25 MG tablet Take 1 tablet (25 mg total) by mouth daily. 30 tablet 11  . zolpidem (AMBIEN) 5 MG tablet Take 1 tablet (5 mg total) by mouth at bedtime as needed for sleep. 30 tablet 2   No facility-administered medications prior to visit.     ROS: Review of Systems  Constitutional: Positive for unexpected weight change. Negative for activity change, appetite change, chills and fatigue.  HENT: Negative for congestion, mouth sores and sinus pressure.   Eyes: Negative for visual disturbance.  Respiratory: Negative for cough and chest tightness.   Gastrointestinal: Negative for abdominal pain and nausea.  Genitourinary: Negative for difficulty urinating, frequency and vaginal pain.  Musculoskeletal: Positive for back pain. Negative for gait problem.  Skin: Negative for pallor and rash.  Neurological: Negative for dizziness, tremors, weakness, numbness and headaches.  Psychiatric/Behavioral: Negative for confusion, sleep disturbance and suicidal ideas.    Objective:  BP 126/84 (BP Location: Left Arm, Patient Position: Sitting, Cuff Size: Large)   Pulse 91   Temp 97.9 F  (36.6 C) (Oral)   Ht 5' 5.75" (1.67 m)   Wt 223 lb (101.2 kg)   SpO2 98%   BMI 36.27 kg/m   BP Readings from Last 3 Encounters:  05/14/18 126/84  03/07/18 121/63  02/11/18 132/86    Wt Readings from Last 3 Encounters:  05/14/18 223 lb (101.2 kg)  02/11/18 216 lb (98 kg)  12/30/17 219 lb (99.3 kg)    Physical Exam  Constitutional: She appears well-developed. No distress.  HENT:  Head: Normocephalic.  Right Ear: External ear normal.  Left Ear: External ear normal.  Nose: Nose normal.  Mouth/Throat: Oropharynx is clear and moist.  Eyes: Pupils are equal, round, and reactive to light. Conjunctivae are normal. Right eye exhibits no discharge. Left eye exhibits no discharge.  Neck: Normal range of motion. Neck supple. No JVD present. No tracheal deviation present. No thyromegaly present.  Cardiovascular: Normal rate, regular rhythm and normal heart sounds.  Pulmonary/Chest: No stridor. No respiratory distress. She has no wheezes.  Abdominal: Soft. Bowel sounds are normal. She exhibits no distension and no mass. There is no tenderness. There is no rebound and no guarding.  Musculoskeletal: She exhibits tenderness. She exhibits no edema.  Lymphadenopathy:    She has no cervical adenopathy.  Neurological: She displays normal reflexes. No cranial nerve deficit. She exhibits normal muscle tone. Coordination normal.  Skin: No rash noted. No erythema.  Psychiatric: She has a normal mood and affect. Her behavior is normal. Judgment and thought content normal.    Lab Results  Component Value Date   WBC 4.5 03/07/2017   HGB 14.6 03/07/2017   HCT 43.8 03/07/2017   PLT 271.0 03/07/2017   GLUCOSE 90 11/07/2017   CHOL 133 03/07/2017   TRIG 73.0 03/07/2017   HDL 47.70 03/07/2017   LDLCALC 71 03/07/2017   ALT 34 03/07/2017   AST 21 03/07/2017   NA 139 11/07/2017   K 4.1 11/07/2017   CL 102 11/07/2017   CREATININE 0.94 11/07/2017   BUN 13 11/07/2017   CO2 30 11/07/2017   TSH 0.81  11/07/2017   INR 1.1 (H) 03/07/2017    Ct Lumbar Spine W Contrast  Result Date: 03/07/2018 CLINICAL DATA:  Lumbosacral spondylosis without myelopathy. Evaluate for pseudarthrosis. RIGHT greater than LEFT leg pain. EXAM: LUMBAR MYELOGRAM FLUOROSCOPY TIME:  42 seconds corresponding to a Dose Area Product of 386.73 Gy*m2 PROCEDURE: After thorough discussion of risks and benefits of the procedure including bleeding, infection, injury to nerves, blood vessels, adjacent structures as well as headache and CSF leak, written and oral informed consent was obtained. Consent was obtained by Dr. Rolla Flatten. Time out form was completed. Patient was positioned prone on the fluoroscopy table. Local anesthesia was provided with 1% lidocaine without epinephrine after prepped and draped in the usual sterile fashion. Puncture was performed at L4-5 using a 3 1/2 inch 22-gauge spinal needle via LEFT paramedian approach. Using a single pass through the dura, the needle was placed within the thecal sac, with return of clear CSF. 15 mL of Isovue M-200 was injected into the thecal sac, with normal opacification of the nerve roots and cauda equina consistent with free flow within the subarachnoid space. I personally performed the lumbar puncture and administered the intrathecal contrast. I also personally supervised acquisition of the myelogram images. TECHNIQUE: Contiguous axial images were obtained through the Lumbar spine after the intrathecal infusion of infusion. Coronal and sagittal reconstructions were obtained of the axial image sets. COMPARISON:  Preoperative MRI 02/18/2017. FINDINGS: LUMBAR MYELOGRAM FINDINGS: Good opacification lumbar subarachnoid space. The patient has undergone L4-5 TLIF. Hardware grossly intact. Interbody cage centrally positioned. At the fusion level, there is no residual stenosis or neural impingement. At the L5-S1 level, adjacent segment, there is mild stenosis, with waist like narrowing of the thecal  sac. Shallow ventral defect is present, presumed protruding disc material with osseous spurring, but no S1 neural encroachment. At L3-4, the level above the fusion segment, there is moderate stenosis, waist like narrowing, due to anterior and posterior defects. The stenosis at L3-4 is exacerbated with patient standing, although anatomic alignment is maintained in the lumbar spine throughout flexion and extension. CT LUMBAR MYELOGRAM FINDINGS: Segmentation: Normal. Alignment:  Normal. Vertebrae: No worrisome osseous lesion. Conus medullaris: Slightly low termination, L2. Paraspinal tissues: No evidence for hydronephrosis or paravertebral mass. Disc levels: L1-L2:  Unremarkable. L2-L3:  Unremarkable. L3-L4: Mild stenosis. Facet arthropathy and ligamentum flavum infolding. Slight annular bulge. No protrusion. No nerve root cut off. L4-L5: Postsurgical change. Status post RIGHT TLIF with partial RIGHT facetectomy. Hardware appropriately placed without loosening. There is a less than solid interbody arthrodesis, but no real subsidence. L5-S1: Annular bulge. Facet arthropathy. Slight subarticular zone narrowing. Disc material extends into both neural foramina with mild narrowing exacerbated by osseous spurring. No definite L5 or S1 neural impingement however. IMPRESSION: LUMBAR MYELOGRAM IMPRESSION: Status post L4-5 TLIF.  No residual impingement at the fusion site. Upright films demonstrate mild stenosis most prominent at L3-4, both annular bulge and ligamentum flavum infolding. Slight effacement L4 nerve  roots. Slight effacement S1 nerve roots also, at L5-S1 level, with waist like narrowing. No dynamic instability on standing flexion extension. CT LUMBAR MYELOGRAM IMPRESSION: Status post L4-5 TLIF. No adverse features, although less than robust interbody arthrodesis. No hardware loosening or cage subsidence. 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 Mild stenosis, but no definite L3 or  L4 neural impingement. Annular bulge and facet arthropathy at L5-S1. Slight subarticular zone and foraminal zone narrowing without definite L5 or S1 neural impingement. Electronically Signed   By: Staci Righter M.D.   On: 03/07/2018 10:42   Dg Myelography Lumbar Inj Lumbosacral  Result Date: 03/07/2018 CLINICAL DATA:  Lumbosacral spondylosis without myelopathy. Evaluate for pseudarthrosis. RIGHT greater than LEFT leg pain. EXAM: LUMBAR MYELOGRAM FLUOROSCOPY TIME:  42 seconds corresponding to a Dose Area Product of 386.73 Gy*m2 PROCEDURE: After thorough discussion of risks and benefits of the procedure including bleeding, infection, injury to nerves, blood vessels, adjacent structures as well as headache and CSF leak, written and oral informed consent was obtained. Consent was obtained by Dr. Rolla Flatten. Time out form was completed. Patient was positioned prone on the fluoroscopy table. Local anesthesia was provided with 1% lidocaine without epinephrine after prepped and draped in the usual sterile fashion. Puncture was performed at L4-5 using a 3 1/2 inch 22-gauge spinal needle via LEFT paramedian approach. Using a single pass through the dura, the needle was placed within the thecal sac, with return of clear CSF. 15 mL of Isovue M-200 was injected into the thecal sac, with normal opacification of the nerve roots and cauda equina consistent with free flow within the subarachnoid space. I personally performed the lumbar puncture and administered the intrathecal contrast. I also personally supervised acquisition of the myelogram images. TECHNIQUE: Contiguous axial images were obtained through the Lumbar spine after the intrathecal infusion of infusion. Coronal and sagittal reconstructions were obtained of the axial image sets. COMPARISON:  Preoperative MRI 02/18/2017. FINDINGS: LUMBAR MYELOGRAM FINDINGS: Good opacification lumbar subarachnoid space. The patient has undergone L4-5 TLIF. Hardware grossly intact.  Interbody cage centrally positioned. At the fusion level, there is no residual stenosis or neural impingement. At the L5-S1 level, adjacent segment, there is mild stenosis, with waist like narrowing of the thecal sac. Shallow ventral defect is present, presumed protruding disc material with osseous spurring, but no S1 neural encroachment. At L3-4, the level above the fusion segment, there is moderate stenosis, waist like narrowing, due to anterior and posterior defects. The stenosis at L3-4 is exacerbated with patient standing, although anatomic alignment is maintained in the lumbar spine throughout flexion and extension. CT LUMBAR MYELOGRAM FINDINGS: Segmentation: Normal. Alignment:  Normal. Vertebrae: No worrisome osseous lesion. Conus medullaris: Slightly low termination, L2. Paraspinal tissues: No evidence for hydronephrosis or paravertebral mass. Disc levels: L1-L2:  Unremarkable. L2-L3:  Unremarkable. L3-L4: Mild stenosis. Facet arthropathy and ligamentum flavum infolding. Slight annular bulge. No protrusion. No nerve root cut off. L4-L5: Postsurgical change. Status post RIGHT TLIF with partial RIGHT facetectomy. Hardware appropriately placed without loosening. There is a less than solid interbody arthrodesis, but no real subsidence. L5-S1: Annular bulge. Facet arthropathy. Slight subarticular zone narrowing. Disc material extends into both neural foramina with mild narrowing exacerbated by osseous spurring. No definite L5 or S1 neural impingement however. IMPRESSION: LUMBAR MYELOGRAM IMPRESSION: Status post L4-5 TLIF.  No residual impingement at the fusion site. Upright films demonstrate mild stenosis most prominent at L3-4, both annular bulge and ligamentum flavum infolding. Slight effacement L4 nerve roots. Slight  effacement S1 nerve roots also, at L5-S1 level, with waist like narrowing. No dynamic instability on standing flexion extension. CT LUMBAR MYELOGRAM IMPRESSION: Status post L4-5 TLIF. No adverse  features, although less than robust interbody arthrodesis. No hardware loosening or cage subsidence. 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 Mild stenosis, but no definite L3 or L4 neural impingement. Annular bulge and facet arthropathy at L5-S1. Slight subarticular zone and foraminal zone narrowing without definite L5 or S1 neural impingement. Electronically Signed   By: Staci Righter M.D.   On: 03/07/2018 10:42    Assessment & Plan:   There are no diagnoses linked to this encounter.   No orders of the defined types were placed in this encounter.    Follow-up: No follow-ups on file.  Walker Kehr, MD

## 2019-03-12 DIAGNOSIS — H52223 Regular astigmatism, bilateral: Secondary | ICD-10-CM | POA: Insufficient documentation

## 2019-03-12 DIAGNOSIS — G43109 Migraine with aura, not intractable, without status migrainosus: Secondary | ICD-10-CM | POA: Insufficient documentation

## 2019-03-12 DIAGNOSIS — H2513 Age-related nuclear cataract, bilateral: Secondary | ICD-10-CM

## 2019-03-12 DIAGNOSIS — H524 Presbyopia: Secondary | ICD-10-CM | POA: Insufficient documentation

## 2019-03-12 DIAGNOSIS — H5203 Hypermetropia, bilateral: Secondary | ICD-10-CM | POA: Insufficient documentation

## 2019-03-12 DIAGNOSIS — C4492 Squamous cell carcinoma of skin, unspecified: Secondary | ICD-10-CM

## 2019-03-12 DIAGNOSIS — H18529 Epithelial (juvenile) corneal dystrophy, unspecified eye: Secondary | ICD-10-CM

## 2019-03-12 DIAGNOSIS — H40013 Open angle with borderline findings, low risk, bilateral: Secondary | ICD-10-CM

## 2019-03-12 HISTORY — DX: Age-related nuclear cataract, bilateral: H25.13

## 2019-03-12 HISTORY — DX: Open angle with borderline findings, low risk, bilateral: H40.013

## 2019-03-12 HISTORY — DX: Migraine with aura, not intractable, without status migrainosus: G43.109

## 2019-03-12 HISTORY — DX: Hypermetropia, bilateral: H52.03

## 2019-03-12 HISTORY — DX: Epithelial (juvenile) corneal dystrophy, unspecified eye: H18.529

## 2019-03-12 HISTORY — DX: Presbyopia: H52.4

## 2019-03-12 HISTORY — DX: Squamous cell carcinoma of skin, unspecified: C44.92

## 2019-03-12 HISTORY — DX: Regular astigmatism, bilateral: H52.223

## 2019-10-06 DIAGNOSIS — I10 Essential (primary) hypertension: Secondary | ICD-10-CM

## 2019-10-06 HISTORY — DX: Essential (primary) hypertension: I10

## 2019-11-17 DIAGNOSIS — I471 Supraventricular tachycardia, unspecified: Secondary | ICD-10-CM | POA: Insufficient documentation

## 2019-11-17 HISTORY — DX: Supraventricular tachycardia, unspecified: I47.10

## 2020-01-02 IMAGING — XA DG MYELOGRAPHY LUMBAR INJ LUMBOSACRAL
12 of 21 series · 12 of 21 positions shown · non-contrast
Comparison: Preoperative MRI 02/18/2017.

CLINICAL DATA: Lumbosacral spondylosis without myelopathy. Evaluate
for pseudarthrosis. RIGHT greater than LEFT leg pain.
TECHNIQUE: Contiguous axial images were obtained through the Lumbar spine after
the intrathecal infusion of infusion. Coronal and sagittal
reconstructions were obtained of the axial image sets.

[Series 1: vasc adipose · 1 of 1 slices shown (1 of 10)]
[im 1/1]
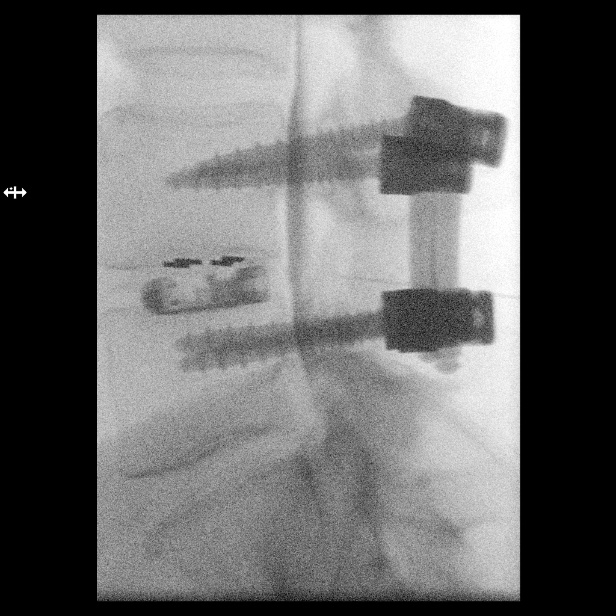

[Series 2: vasc adipose · 1 of 1 slices shown (2 of 10)]
[im 1/1]
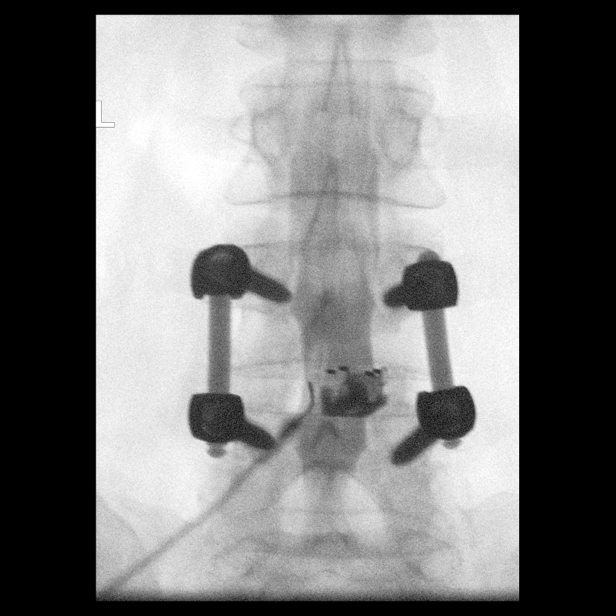

[Series 3: w lumbar spine flexion · 0.15mm/px · 1 of 1 slices shown]
[im 1/1]
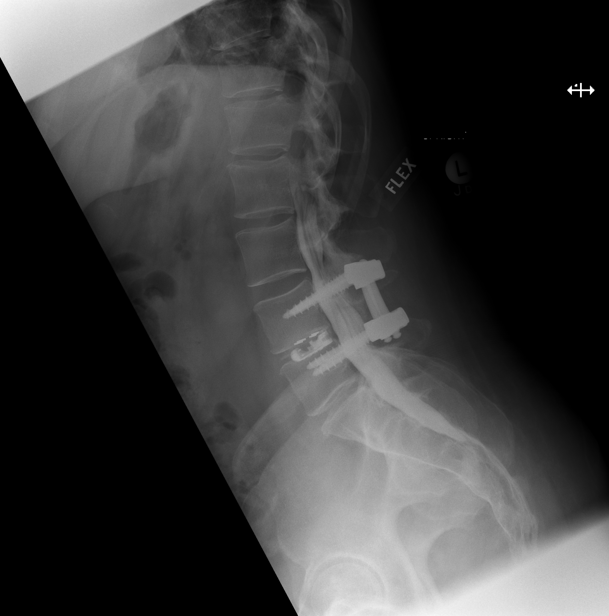

[Series 4: w lumbar spine extension · 0.15mm/px · 1 of 1 slices shown]
[im 1/1]
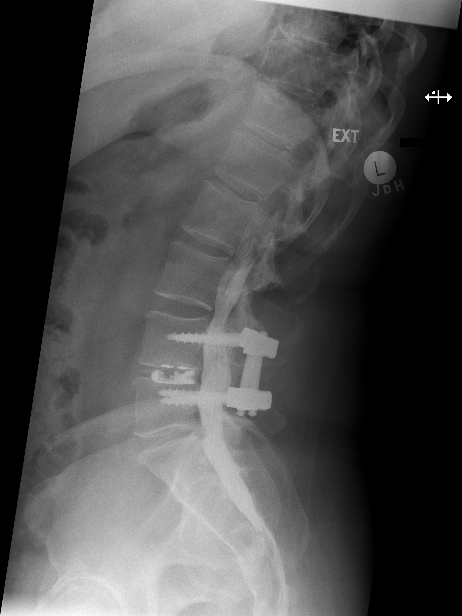

[Series 4: vasc adipose · 1 of 1 slices shown (3 of 10)]
[im 1/1]
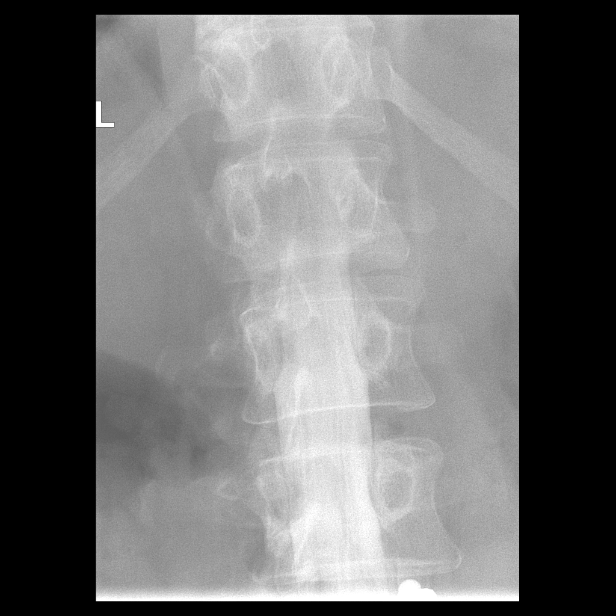

[Series 6: vasc adipose · 1 of 1 slices shown (4 of 10)]
[im 1/1]
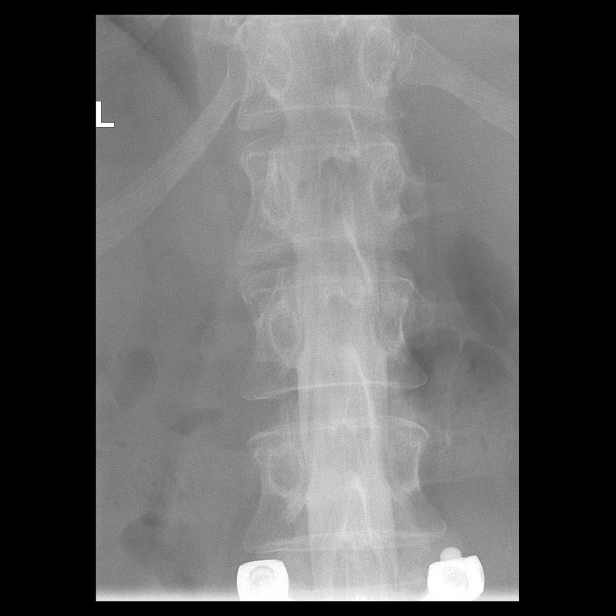

[Series 8: vasc adipose · 1 of 1 slices shown (5 of 10)]
[im 1/1]
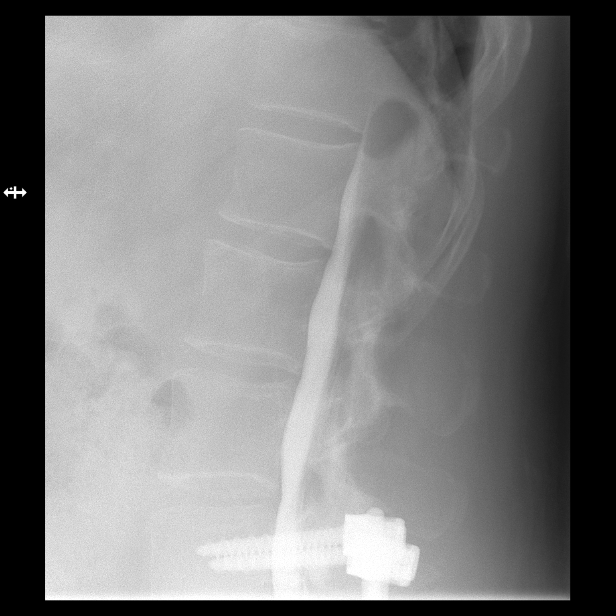

[Series 10: vasc adipose · 1 of 1 slices shown (6 of 10)]
[im 1/1]
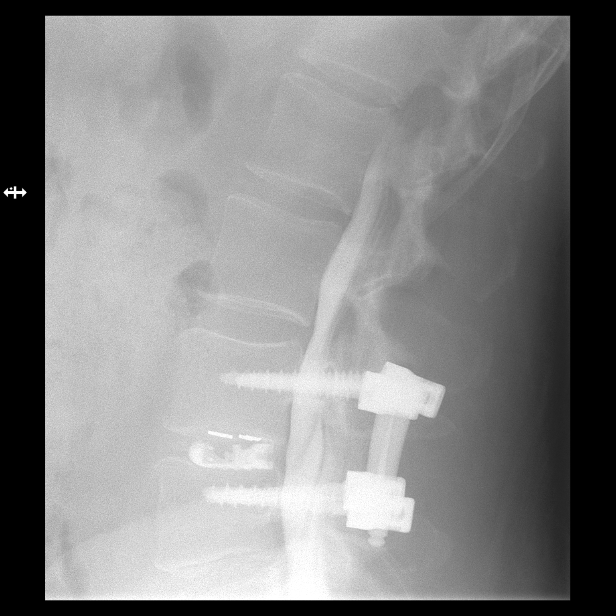

[Series 11: vasc adipose · 1 of 1 slices shown (7 of 10)]
[im 1/1]
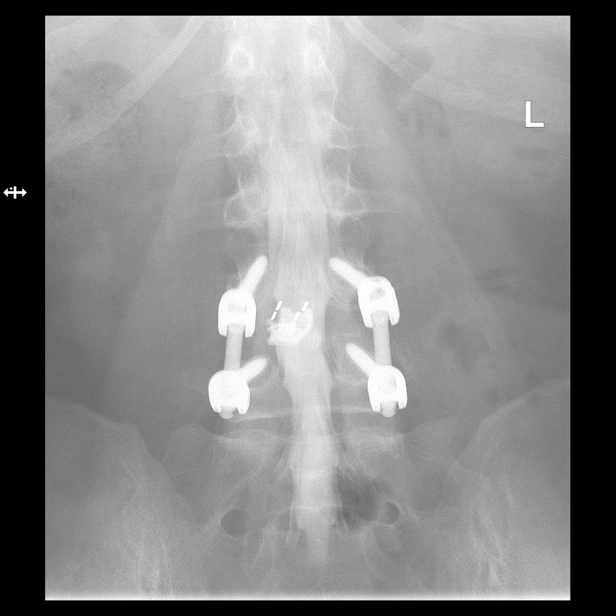

[Series 13: vasc adipose · 1 of 1 slices shown (8 of 10)]
[im 1/1]
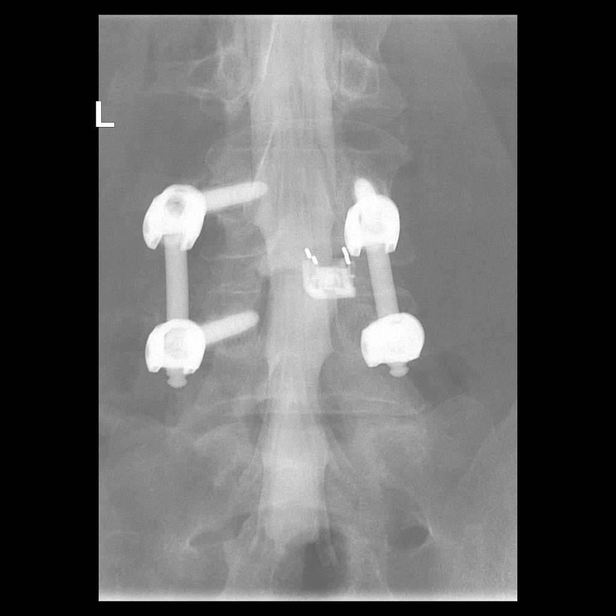

[Series 15: vasc adipose · 1 of 1 slices shown (9 of 10)]
[im 1/1]
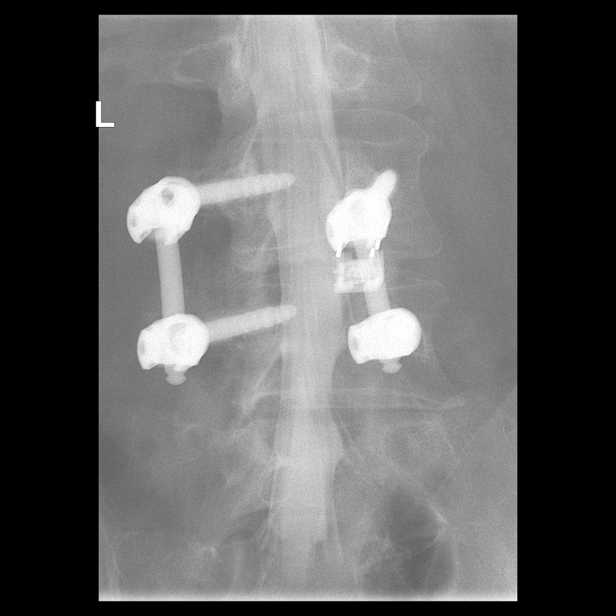

[Series 17: vasc adipose · 1 of 1 slices shown (10 of 10)]
[im 1/1]
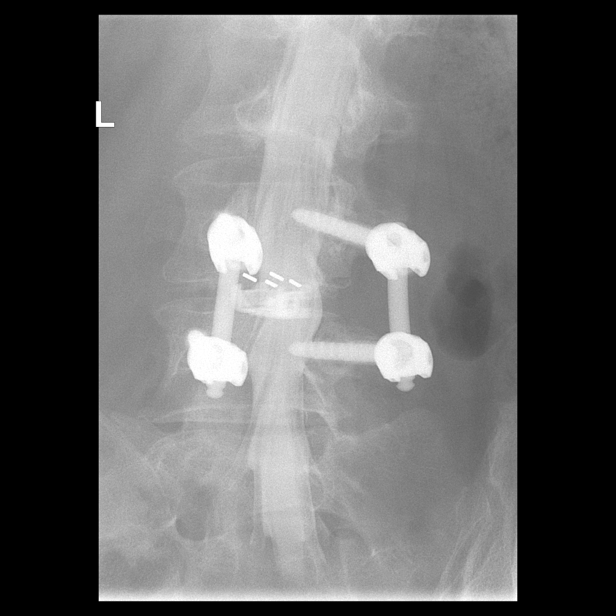

[12 of 21 positions shown; findings below may reference images not displayed]

EXAM:
LUMBAR MYELOGRAM

FLUOROSCOPY TIME:  42 seconds corresponding to a Dose Area Product
of 386.73 Gy*m2

PROCEDURE:
After thorough discussion of risks and benefits of the procedure
including bleeding, infection, injury to nerves, blood vessels,
adjacent structures as well as headache and CSF leak, written and
oral informed consent was obtained. Consent was obtained by Dr. Mccormack
Mahamadou. Time out form was completed.

Patient was positioned prone on the fluoroscopy table. Local
anesthesia was provided with 1% lidocaine without epinephrine after
prepped and draped in the usual sterile fashion. Puncture was
performed at L4-5 using a 3 1/2 inch 22-gauge spinal needle via LEFT
paramedian approach. Using a single pass through the dura, the
needle was placed within the thecal sac, with return of clear CSF.
15 mL of Isovue O-KGG was injected into the thecal sac, with normal
opacification of the nerve roots and cauda equina consistent with
free flow within the subarachnoid space.

I personally performed the lumbar puncture and administered the
intrathecal contrast. I also personally supervised acquisition of
the myelogram images.
FINDINGS: LUMBAR MYELOGRAM FINDINGS:

Good opacification lumbar subarachnoid space. The patient has
undergone L4-5 TLIF. Hardware grossly intact. Interbody cage
centrally positioned.

At the fusion level, there is no residual stenosis or neural
impingement. At the L5-S1 level, adjacent segment, there is mild
stenosis, with waist like narrowing of the thecal sac. Shallow
ventral defect is present, presumed protruding disc material with
osseous spurring, but no S1 neural encroachment.

At L3-4, the level above the fusion segment, there is moderate
stenosis, waist like narrowing, due to anterior and posterior
defects.

The stenosis at L3-4 is exacerbated with patient standing, although
anatomic alignment is maintained in the lumbar spine throughout
flexion and extension.

CT LUMBAR MYELOGRAM FINDINGS:

Segmentation: Normal.

Alignment:  Normal.

Vertebrae: No worrisome osseous lesion.

Conus medullaris: Slightly low termination, L2.

Paraspinal tissues: No evidence for hydronephrosis or paravertebral
mass.

Disc levels:

L1-L2:  Unremarkable.

L2-L3:  Unremarkable.

L3-L4: Mild stenosis. Facet arthropathy and ligamentum flavum
infolding. Slight annular bulge. No protrusion. No nerve root cut
off.

L4-L5: Postsurgical change. Status post RIGHT TLIF with partial
RIGHT facetectomy. Hardware appropriately placed without loosening.
There is a less than solid interbody arthrodesis, but no real
subsidence.

L5-S1: Annular bulge. Facet arthropathy. Slight subarticular zone
narrowing. Disc material extends into both neural foramina with mild
narrowing exacerbated by osseous spurring. No definite L5 or S1
neural impingement however.
IMPRESSION: LUMBAR MYELOGRAM IMPRESSION:

Status post L4-5 TLIF.  No residual impingement at the fusion site.

Upright films demonstrate mild stenosis most prominent at L3-4, both
annular bulge and ligamentum flavum infolding. Slight effacement L4
nerve roots. Slight effacement S1 nerve roots also, at L5-S1 level,
with waist like narrowing.

No dynamic instability on standing flexion extension.

CT LUMBAR MYELOGRAM IMPRESSION:

Status post L4-5 TLIF. No adverse features, although less than
robust interbody arthrodesis. No hardware loosening or cage
subsidence.

Mild adjacent segment disease L3-4, primarily posterior element
hypertrophy. Mild stenosis, but no definite L3 or L4 neural
impingement.

Annular bulge and facet arthropathy at L5-S1. Slight subarticular
zone and foraminal zone narrowing without definite L5 or S1 neural
impingement.

## 2020-12-08 ENCOUNTER — Encounter (HOSPITAL_BASED_OUTPATIENT_CLINIC_OR_DEPARTMENT_OTHER): Payer: Self-pay

## 2020-12-08 ENCOUNTER — Ambulatory Visit (HOSPITAL_BASED_OUTPATIENT_CLINIC_OR_DEPARTMENT_OTHER): Admit: 2020-12-08 | Payer: BLUE CROSS/BLUE SHIELD | Admitting: Orthopedic Surgery

## 2020-12-08 ENCOUNTER — Encounter (HOSPITAL_COMMUNITY): Payer: Self-pay | Admitting: Certified Registered"

## 2020-12-08 DIAGNOSIS — L03113 Cellulitis of right upper limb: Secondary | ICD-10-CM | POA: Insufficient documentation

## 2020-12-08 HISTORY — DX: Cellulitis of right upper limb: L03.113

## 2020-12-08 SURGERY — INCISION AND DRAINAGE
Anesthesia: Choice | Laterality: Right

## 2020-12-09 DIAGNOSIS — S66811A Strain of other specified muscles, fascia and tendons at wrist and hand level, right hand, initial encounter: Secondary | ICD-10-CM

## 2020-12-09 HISTORY — DX: Strain of other specified muscles, fascia and tendons at wrist and hand level, right hand, initial encounter: S66.811A

## 2020-12-21 DIAGNOSIS — M009 Pyogenic arthritis, unspecified: Secondary | ICD-10-CM

## 2020-12-21 DIAGNOSIS — W5501XS Bitten by cat, sequela: Secondary | ICD-10-CM

## 2020-12-21 DIAGNOSIS — A280 Pasteurellosis: Secondary | ICD-10-CM

## 2020-12-21 DIAGNOSIS — W5501XA Bitten by cat, initial encounter: Secondary | ICD-10-CM | POA: Insufficient documentation

## 2020-12-21 HISTORY — DX: Bitten by cat, sequela: W55.01XS

## 2020-12-21 HISTORY — DX: Pyogenic arthritis, unspecified: M00.9

## 2020-12-21 HISTORY — DX: Pasteurellosis: A28.0

## 2021-02-03 DIAGNOSIS — S66811S Strain of other specified muscles, fascia and tendons at wrist and hand level, right hand, sequela: Secondary | ICD-10-CM | POA: Diagnosis not present

## 2021-02-03 DIAGNOSIS — L03113 Cellulitis of right upper limb: Secondary | ICD-10-CM | POA: Diagnosis not present

## 2021-02-03 DIAGNOSIS — S61451S Open bite of right hand, sequela: Secondary | ICD-10-CM | POA: Diagnosis not present

## 2021-02-03 DIAGNOSIS — A280 Pasteurellosis: Secondary | ICD-10-CM | POA: Diagnosis not present

## 2021-02-03 DIAGNOSIS — X58XXXS Exposure to other specified factors, sequela: Secondary | ICD-10-CM | POA: Diagnosis not present

## 2021-02-03 DIAGNOSIS — W5501XS Bitten by cat, sequela: Secondary | ICD-10-CM | POA: Diagnosis not present

## 2021-02-16 DIAGNOSIS — M25511 Pain in right shoulder: Secondary | ICD-10-CM | POA: Diagnosis not present

## 2021-03-07 DIAGNOSIS — M4726 Other spondylosis with radiculopathy, lumbar region: Secondary | ICD-10-CM | POA: Diagnosis not present

## 2021-03-07 DIAGNOSIS — M5431 Sciatica, right side: Secondary | ICD-10-CM | POA: Diagnosis not present

## 2021-03-07 DIAGNOSIS — M5432 Sciatica, left side: Secondary | ICD-10-CM | POA: Diagnosis not present

## 2021-03-07 DIAGNOSIS — M4326 Fusion of spine, lumbar region: Secondary | ICD-10-CM | POA: Diagnosis not present

## 2021-03-20 DIAGNOSIS — G8929 Other chronic pain: Secondary | ICD-10-CM | POA: Diagnosis not present

## 2021-03-20 DIAGNOSIS — M5441 Lumbago with sciatica, right side: Secondary | ICD-10-CM | POA: Diagnosis not present

## 2021-03-20 DIAGNOSIS — M4726 Other spondylosis with radiculopathy, lumbar region: Secondary | ICD-10-CM | POA: Diagnosis not present

## 2021-03-20 DIAGNOSIS — R531 Weakness: Secondary | ICD-10-CM | POA: Diagnosis not present

## 2021-03-20 DIAGNOSIS — M545 Low back pain, unspecified: Secondary | ICD-10-CM | POA: Diagnosis not present

## 2021-03-20 DIAGNOSIS — M5442 Lumbago with sciatica, left side: Secondary | ICD-10-CM | POA: Diagnosis not present

## 2021-03-22 DIAGNOSIS — H18523 Epithelial (juvenile) corneal dystrophy, bilateral: Secondary | ICD-10-CM | POA: Diagnosis not present

## 2021-03-22 DIAGNOSIS — H2513 Age-related nuclear cataract, bilateral: Secondary | ICD-10-CM | POA: Diagnosis not present

## 2021-03-22 DIAGNOSIS — H25043 Posterior subcapsular polar age-related cataract, bilateral: Secondary | ICD-10-CM | POA: Diagnosis not present

## 2021-03-22 DIAGNOSIS — H40013 Open angle with borderline findings, low risk, bilateral: Secondary | ICD-10-CM | POA: Diagnosis not present

## 2021-03-22 DIAGNOSIS — G43119 Migraine with aura, intractable, without status migrainosus: Secondary | ICD-10-CM | POA: Diagnosis not present

## 2021-03-24 DIAGNOSIS — M4726 Other spondylosis with radiculopathy, lumbar region: Secondary | ICD-10-CM | POA: Diagnosis not present

## 2021-03-24 DIAGNOSIS — R531 Weakness: Secondary | ICD-10-CM | POA: Diagnosis not present

## 2021-03-24 DIAGNOSIS — G8929 Other chronic pain: Secondary | ICD-10-CM | POA: Diagnosis not present

## 2021-03-24 DIAGNOSIS — M545 Low back pain, unspecified: Secondary | ICD-10-CM | POA: Diagnosis not present

## 2021-03-24 DIAGNOSIS — M5442 Lumbago with sciatica, left side: Secondary | ICD-10-CM | POA: Diagnosis not present

## 2021-03-24 DIAGNOSIS — M5441 Lumbago with sciatica, right side: Secondary | ICD-10-CM | POA: Diagnosis not present

## 2021-03-29 DIAGNOSIS — M4726 Other spondylosis with radiculopathy, lumbar region: Secondary | ICD-10-CM | POA: Diagnosis not present

## 2021-03-29 DIAGNOSIS — G8929 Other chronic pain: Secondary | ICD-10-CM | POA: Diagnosis not present

## 2021-03-29 DIAGNOSIS — M5441 Lumbago with sciatica, right side: Secondary | ICD-10-CM | POA: Diagnosis not present

## 2021-03-29 DIAGNOSIS — M545 Low back pain, unspecified: Secondary | ICD-10-CM | POA: Diagnosis not present

## 2021-03-29 DIAGNOSIS — M5442 Lumbago with sciatica, left side: Secondary | ICD-10-CM | POA: Diagnosis not present

## 2021-03-29 DIAGNOSIS — R531 Weakness: Secondary | ICD-10-CM | POA: Diagnosis not present

## 2021-04-04 DIAGNOSIS — M4326 Fusion of spine, lumbar region: Secondary | ICD-10-CM | POA: Diagnosis not present

## 2021-04-04 DIAGNOSIS — M4726 Other spondylosis with radiculopathy, lumbar region: Secondary | ICD-10-CM | POA: Diagnosis not present

## 2021-04-04 DIAGNOSIS — M5431 Sciatica, right side: Secondary | ICD-10-CM | POA: Diagnosis not present

## 2021-04-04 DIAGNOSIS — M961 Postlaminectomy syndrome, not elsewhere classified: Secondary | ICD-10-CM | POA: Diagnosis not present

## 2021-05-07 DIAGNOSIS — Z981 Arthrodesis status: Secondary | ICD-10-CM

## 2021-05-07 HISTORY — DX: Arthrodesis status: Z98.1

## 2021-05-15 DIAGNOSIS — M4726 Other spondylosis with radiculopathy, lumbar region: Secondary | ICD-10-CM | POA: Diagnosis not present

## 2021-05-15 DIAGNOSIS — M48061 Spinal stenosis, lumbar region without neurogenic claudication: Secondary | ICD-10-CM | POA: Diagnosis not present

## 2021-05-17 DIAGNOSIS — R4589 Other symptoms and signs involving emotional state: Secondary | ICD-10-CM | POA: Diagnosis not present

## 2021-05-17 DIAGNOSIS — I471 Supraventricular tachycardia: Secondary | ICD-10-CM | POA: Diagnosis not present

## 2021-05-17 DIAGNOSIS — R45851 Suicidal ideations: Secondary | ICD-10-CM | POA: Diagnosis not present

## 2021-05-17 DIAGNOSIS — R609 Edema, unspecified: Secondary | ICD-10-CM | POA: Diagnosis not present

## 2021-05-17 DIAGNOSIS — I10 Essential (primary) hypertension: Secondary | ICD-10-CM | POA: Diagnosis not present

## 2021-05-17 DIAGNOSIS — M4727 Other spondylosis with radiculopathy, lumbosacral region: Secondary | ICD-10-CM | POA: Diagnosis not present

## 2021-05-18 DIAGNOSIS — M961 Postlaminectomy syndrome, not elsewhere classified: Secondary | ICD-10-CM | POA: Diagnosis not present

## 2021-05-18 DIAGNOSIS — M4726 Other spondylosis with radiculopathy, lumbar region: Secondary | ICD-10-CM | POA: Diagnosis not present

## 2021-05-18 DIAGNOSIS — M5431 Sciatica, right side: Secondary | ICD-10-CM | POA: Diagnosis not present

## 2021-05-18 DIAGNOSIS — M5432 Sciatica, left side: Secondary | ICD-10-CM | POA: Diagnosis not present

## 2021-05-19 DIAGNOSIS — M961 Postlaminectomy syndrome, not elsewhere classified: Secondary | ICD-10-CM

## 2021-05-19 HISTORY — DX: Postlaminectomy syndrome, not elsewhere classified: M96.1

## 2021-06-02 DIAGNOSIS — M4726 Other spondylosis with radiculopathy, lumbar region: Secondary | ICD-10-CM | POA: Diagnosis not present

## 2021-06-02 DIAGNOSIS — M48062 Spinal stenosis, lumbar region with neurogenic claudication: Secondary | ICD-10-CM | POA: Diagnosis not present

## 2021-06-02 DIAGNOSIS — M5431 Sciatica, right side: Secondary | ICD-10-CM | POA: Diagnosis not present

## 2021-06-02 DIAGNOSIS — M961 Postlaminectomy syndrome, not elsewhere classified: Secondary | ICD-10-CM | POA: Diagnosis not present

## 2021-06-04 DIAGNOSIS — M5431 Sciatica, right side: Secondary | ICD-10-CM | POA: Insufficient documentation

## 2021-06-04 DIAGNOSIS — H2513 Age-related nuclear cataract, bilateral: Secondary | ICD-10-CM | POA: Insufficient documentation

## 2021-06-04 HISTORY — DX: Age-related nuclear cataract, bilateral: H25.13

## 2021-06-04 HISTORY — DX: Sciatica, right side: M54.31

## 2021-06-06 DIAGNOSIS — I498 Other specified cardiac arrhythmias: Secondary | ICD-10-CM | POA: Diagnosis not present

## 2021-06-06 DIAGNOSIS — Z01810 Encounter for preprocedural cardiovascular examination: Secondary | ICD-10-CM | POA: Diagnosis not present

## 2021-06-07 DIAGNOSIS — I498 Other specified cardiac arrhythmias: Secondary | ICD-10-CM | POA: Diagnosis not present

## 2021-06-07 DIAGNOSIS — I10 Essential (primary) hypertension: Secondary | ICD-10-CM | POA: Diagnosis not present

## 2021-06-07 DIAGNOSIS — M48062 Spinal stenosis, lumbar region with neurogenic claudication: Secondary | ICD-10-CM

## 2021-06-07 DIAGNOSIS — Z01818 Encounter for other preprocedural examination: Secondary | ICD-10-CM | POA: Diagnosis not present

## 2021-06-07 DIAGNOSIS — Z981 Arthrodesis status: Secondary | ICD-10-CM | POA: Diagnosis not present

## 2021-06-07 DIAGNOSIS — M7138 Other bursal cyst, other site: Secondary | ICD-10-CM | POA: Diagnosis not present

## 2021-06-07 DIAGNOSIS — Z87891 Personal history of nicotine dependence: Secondary | ICD-10-CM | POA: Diagnosis not present

## 2021-06-07 DIAGNOSIS — M4326 Fusion of spine, lumbar region: Secondary | ICD-10-CM | POA: Diagnosis not present

## 2021-06-07 DIAGNOSIS — M4726 Other spondylosis with radiculopathy, lumbar region: Secondary | ICD-10-CM | POA: Diagnosis not present

## 2021-06-07 DIAGNOSIS — M5431 Sciatica, right side: Secondary | ICD-10-CM | POA: Diagnosis not present

## 2021-06-07 DIAGNOSIS — M961 Postlaminectomy syndrome, not elsewhere classified: Secondary | ICD-10-CM | POA: Diagnosis not present

## 2021-06-07 HISTORY — DX: Spinal stenosis, lumbar region with neurogenic claudication: M48.062

## 2021-06-08 DIAGNOSIS — M4326 Fusion of spine, lumbar region: Secondary | ICD-10-CM | POA: Diagnosis not present

## 2021-06-08 DIAGNOSIS — Z9049 Acquired absence of other specified parts of digestive tract: Secondary | ICD-10-CM | POA: Diagnosis not present

## 2021-06-08 DIAGNOSIS — Z9889 Other specified postprocedural states: Secondary | ICD-10-CM | POA: Diagnosis not present

## 2021-06-08 DIAGNOSIS — Z981 Arthrodesis status: Secondary | ICD-10-CM | POA: Diagnosis not present

## 2021-06-11 DIAGNOSIS — M5431 Sciatica, right side: Secondary | ICD-10-CM | POA: Diagnosis not present

## 2021-06-11 DIAGNOSIS — H2513 Age-related nuclear cataract, bilateral: Secondary | ICD-10-CM | POA: Diagnosis not present

## 2021-06-11 DIAGNOSIS — M4720 Other spondylosis with radiculopathy, site unspecified: Secondary | ICD-10-CM | POA: Diagnosis not present

## 2021-06-11 DIAGNOSIS — Z981 Arthrodesis status: Secondary | ICD-10-CM | POA: Diagnosis not present

## 2021-06-11 DIAGNOSIS — G43109 Migraine with aura, not intractable, without status migrainosus: Secondary | ICD-10-CM | POA: Diagnosis not present

## 2021-06-11 DIAGNOSIS — I10 Essential (primary) hypertension: Secondary | ICD-10-CM | POA: Diagnosis not present

## 2021-06-11 DIAGNOSIS — I872 Venous insufficiency (chronic) (peripheral): Secondary | ICD-10-CM | POA: Diagnosis not present

## 2021-06-11 DIAGNOSIS — I471 Supraventricular tachycardia: Secondary | ICD-10-CM | POA: Diagnosis not present

## 2021-06-11 DIAGNOSIS — Z4789 Encounter for other orthopedic aftercare: Secondary | ICD-10-CM | POA: Diagnosis not present

## 2021-06-11 DIAGNOSIS — H4010X0 Unspecified open-angle glaucoma, stage unspecified: Secondary | ICD-10-CM | POA: Diagnosis not present

## 2021-06-11 DIAGNOSIS — M48062 Spinal stenosis, lumbar region with neurogenic claudication: Secondary | ICD-10-CM | POA: Diagnosis not present

## 2021-06-11 DIAGNOSIS — H18599 Other hereditary corneal dystrophies, unspecified eye: Secondary | ICD-10-CM | POA: Diagnosis not present

## 2021-07-04 DIAGNOSIS — M4326 Fusion of spine, lumbar region: Secondary | ICD-10-CM | POA: Diagnosis not present

## 2021-07-11 DIAGNOSIS — M4720 Other spondylosis with radiculopathy, site unspecified: Secondary | ICD-10-CM | POA: Diagnosis not present

## 2021-07-11 DIAGNOSIS — Z4789 Encounter for other orthopedic aftercare: Secondary | ICD-10-CM | POA: Diagnosis not present

## 2021-07-11 DIAGNOSIS — M5431 Sciatica, right side: Secondary | ICD-10-CM | POA: Diagnosis not present

## 2021-07-11 DIAGNOSIS — M48062 Spinal stenosis, lumbar region with neurogenic claudication: Secondary | ICD-10-CM | POA: Diagnosis not present

## 2021-07-27 DIAGNOSIS — M1712 Unilateral primary osteoarthritis, left knee: Secondary | ICD-10-CM | POA: Diagnosis not present

## 2021-08-31 DIAGNOSIS — M4326 Fusion of spine, lumbar region: Secondary | ICD-10-CM | POA: Diagnosis not present

## 2021-09-01 DIAGNOSIS — M170 Bilateral primary osteoarthritis of knee: Secondary | ICD-10-CM | POA: Diagnosis not present

## 2021-09-01 DIAGNOSIS — M25561 Pain in right knee: Secondary | ICD-10-CM | POA: Diagnosis not present

## 2021-09-26 DIAGNOSIS — I872 Venous insufficiency (chronic) (peripheral): Secondary | ICD-10-CM | POA: Diagnosis not present

## 2021-09-26 DIAGNOSIS — I10 Essential (primary) hypertension: Secondary | ICD-10-CM | POA: Diagnosis not present

## 2021-09-26 DIAGNOSIS — Z7689 Persons encountering health services in other specified circumstances: Secondary | ICD-10-CM | POA: Diagnosis not present

## 2021-10-06 DIAGNOSIS — R519 Headache, unspecified: Secondary | ICD-10-CM | POA: Diagnosis not present

## 2021-10-06 DIAGNOSIS — J019 Acute sinusitis, unspecified: Secondary | ICD-10-CM | POA: Diagnosis not present

## 2021-10-06 DIAGNOSIS — I83899 Varicose veins of unspecified lower extremities with other complications: Secondary | ICD-10-CM | POA: Diagnosis not present

## 2021-10-11 DIAGNOSIS — I10 Essential (primary) hypertension: Secondary | ICD-10-CM | POA: Diagnosis not present

## 2021-10-11 DIAGNOSIS — I872 Venous insufficiency (chronic) (peripheral): Secondary | ICD-10-CM | POA: Diagnosis not present

## 2021-10-11 DIAGNOSIS — G4700 Insomnia, unspecified: Secondary | ICD-10-CM | POA: Diagnosis not present

## 2021-10-23 DIAGNOSIS — G4700 Insomnia, unspecified: Secondary | ICD-10-CM | POA: Diagnosis not present

## 2021-10-23 DIAGNOSIS — I872 Venous insufficiency (chronic) (peripheral): Secondary | ICD-10-CM | POA: Diagnosis not present

## 2021-10-23 DIAGNOSIS — H61892 Other specified disorders of left external ear: Secondary | ICD-10-CM | POA: Insufficient documentation

## 2021-10-23 DIAGNOSIS — I10 Essential (primary) hypertension: Secondary | ICD-10-CM | POA: Diagnosis not present

## 2021-10-23 HISTORY — DX: Other specified disorders of left external ear: H61.892

## 2021-11-23 DIAGNOSIS — M961 Postlaminectomy syndrome, not elsewhere classified: Secondary | ICD-10-CM | POA: Diagnosis not present

## 2021-11-23 DIAGNOSIS — M48062 Spinal stenosis, lumbar region with neurogenic claudication: Secondary | ICD-10-CM | POA: Diagnosis not present

## 2021-11-23 DIAGNOSIS — M549 Dorsalgia, unspecified: Secondary | ICD-10-CM | POA: Diagnosis not present

## 2021-11-23 DIAGNOSIS — M4326 Fusion of spine, lumbar region: Secondary | ICD-10-CM | POA: Diagnosis not present

## 2022-01-05 DIAGNOSIS — M009 Pyogenic arthritis, unspecified: Secondary | ICD-10-CM | POA: Diagnosis not present

## 2022-01-05 DIAGNOSIS — H2513 Age-related nuclear cataract, bilateral: Secondary | ICD-10-CM | POA: Diagnosis not present

## 2022-01-05 DIAGNOSIS — I471 Supraventricular tachycardia: Secondary | ICD-10-CM | POA: Diagnosis not present

## 2022-01-05 DIAGNOSIS — I509 Heart failure, unspecified: Secondary | ICD-10-CM | POA: Diagnosis not present

## 2022-01-05 DIAGNOSIS — R000 Tachycardia, unspecified: Secondary | ICD-10-CM | POA: Diagnosis not present

## 2022-01-05 DIAGNOSIS — Z85828 Personal history of other malignant neoplasm of skin: Secondary | ICD-10-CM | POA: Diagnosis not present

## 2022-01-05 DIAGNOSIS — G4700 Insomnia, unspecified: Secondary | ICD-10-CM | POA: Diagnosis not present

## 2022-01-05 DIAGNOSIS — Z833 Family history of diabetes mellitus: Secondary | ICD-10-CM | POA: Diagnosis not present

## 2022-01-05 DIAGNOSIS — Z9049 Acquired absence of other specified parts of digestive tract: Secondary | ICD-10-CM | POA: Diagnosis not present

## 2022-01-05 DIAGNOSIS — Z79899 Other long term (current) drug therapy: Secondary | ICD-10-CM | POA: Diagnosis not present

## 2022-01-05 DIAGNOSIS — Z6841 Body Mass Index (BMI) 40.0 and over, adult: Secondary | ICD-10-CM | POA: Diagnosis not present

## 2022-01-05 DIAGNOSIS — I5033 Acute on chronic diastolic (congestive) heart failure: Secondary | ICD-10-CM | POA: Diagnosis not present

## 2022-01-05 DIAGNOSIS — Z7901 Long term (current) use of anticoagulants: Secondary | ICD-10-CM | POA: Diagnosis not present

## 2022-01-05 DIAGNOSIS — I248 Other forms of acute ischemic heart disease: Secondary | ICD-10-CM | POA: Diagnosis not present

## 2022-01-05 DIAGNOSIS — R079 Chest pain, unspecified: Secondary | ICD-10-CM | POA: Diagnosis not present

## 2022-01-05 DIAGNOSIS — Z90710 Acquired absence of both cervix and uterus: Secondary | ICD-10-CM | POA: Diagnosis not present

## 2022-01-05 DIAGNOSIS — Z8261 Family history of arthritis: Secondary | ICD-10-CM | POA: Diagnosis not present

## 2022-01-05 DIAGNOSIS — Z8616 Personal history of COVID-19: Secondary | ICD-10-CM | POA: Diagnosis not present

## 2022-01-05 DIAGNOSIS — Z8249 Family history of ischemic heart disease and other diseases of the circulatory system: Secondary | ICD-10-CM | POA: Diagnosis not present

## 2022-01-05 DIAGNOSIS — R778 Other specified abnormalities of plasma proteins: Secondary | ICD-10-CM | POA: Diagnosis not present

## 2022-01-05 DIAGNOSIS — I110 Hypertensive heart disease with heart failure: Secondary | ICD-10-CM | POA: Diagnosis not present

## 2022-01-05 DIAGNOSIS — I493 Ventricular premature depolarization: Secondary | ICD-10-CM | POA: Diagnosis not present

## 2022-01-05 DIAGNOSIS — Z87891 Personal history of nicotine dependence: Secondary | ICD-10-CM | POA: Diagnosis not present

## 2022-01-05 DIAGNOSIS — R002 Palpitations: Secondary | ICD-10-CM | POA: Diagnosis not present

## 2022-01-05 DIAGNOSIS — Z6839 Body mass index (BMI) 39.0-39.9, adult: Secondary | ICD-10-CM | POA: Diagnosis not present

## 2022-01-05 DIAGNOSIS — I872 Venous insufficiency (chronic) (peripheral): Secondary | ICD-10-CM | POA: Diagnosis not present

## 2022-01-07 DIAGNOSIS — R778 Other specified abnormalities of plasma proteins: Secondary | ICD-10-CM | POA: Diagnosis not present

## 2022-01-07 DIAGNOSIS — I5033 Acute on chronic diastolic (congestive) heart failure: Secondary | ICD-10-CM | POA: Diagnosis not present

## 2022-01-07 DIAGNOSIS — I110 Hypertensive heart disease with heart failure: Secondary | ICD-10-CM | POA: Diagnosis not present

## 2022-01-07 DIAGNOSIS — R079 Chest pain, unspecified: Secondary | ICD-10-CM | POA: Diagnosis not present

## 2022-01-07 DIAGNOSIS — Z8739 Personal history of other diseases of the musculoskeletal system and connective tissue: Secondary | ICD-10-CM | POA: Diagnosis not present

## 2022-01-07 DIAGNOSIS — I10 Essential (primary) hypertension: Secondary | ICD-10-CM | POA: Diagnosis not present

## 2022-01-07 DIAGNOSIS — I214 Non-ST elevation (NSTEMI) myocardial infarction: Secondary | ICD-10-CM | POA: Diagnosis not present

## 2022-01-07 DIAGNOSIS — I471 Supraventricular tachycardia: Secondary | ICD-10-CM | POA: Diagnosis not present

## 2022-01-07 DIAGNOSIS — R609 Edema, unspecified: Secondary | ICD-10-CM | POA: Diagnosis not present

## 2022-01-08 DIAGNOSIS — I491 Atrial premature depolarization: Secondary | ICD-10-CM | POA: Diagnosis not present

## 2022-01-11 DIAGNOSIS — I471 Supraventricular tachycardia: Secondary | ICD-10-CM | POA: Diagnosis not present

## 2022-01-11 DIAGNOSIS — R778 Other specified abnormalities of plasma proteins: Secondary | ICD-10-CM | POA: Diagnosis not present

## 2022-01-16 DIAGNOSIS — I872 Venous insufficiency (chronic) (peripheral): Secondary | ICD-10-CM | POA: Diagnosis not present

## 2022-01-16 DIAGNOSIS — H0012 Chalazion right lower eyelid: Secondary | ICD-10-CM | POA: Diagnosis not present

## 2022-01-16 DIAGNOSIS — R2243 Localized swelling, mass and lump, lower limb, bilateral: Secondary | ICD-10-CM | POA: Diagnosis not present

## 2022-01-16 DIAGNOSIS — H0015 Chalazion left lower eyelid: Secondary | ICD-10-CM | POA: Diagnosis not present

## 2022-01-16 DIAGNOSIS — R0601 Orthopnea: Secondary | ICD-10-CM | POA: Diagnosis not present

## 2022-01-17 DIAGNOSIS — R2243 Localized swelling, mass and lump, lower limb, bilateral: Secondary | ICD-10-CM | POA: Insufficient documentation

## 2022-01-17 DIAGNOSIS — R0601 Orthopnea: Secondary | ICD-10-CM | POA: Insufficient documentation

## 2022-01-17 DIAGNOSIS — R0600 Dyspnea, unspecified: Secondary | ICD-10-CM | POA: Diagnosis not present

## 2022-01-17 HISTORY — DX: Orthopnea: R06.01

## 2022-01-17 HISTORY — DX: Localized swelling, mass and lump, lower limb, bilateral: R22.43

## 2022-01-23 DIAGNOSIS — I10 Essential (primary) hypertension: Secondary | ICD-10-CM | POA: Diagnosis not present

## 2022-01-23 DIAGNOSIS — I471 Supraventricular tachycardia: Secondary | ICD-10-CM | POA: Diagnosis not present

## 2022-02-06 ENCOUNTER — Other Ambulatory Visit: Payer: Self-pay

## 2022-02-06 DIAGNOSIS — B019 Varicella without complication: Secondary | ICD-10-CM | POA: Insufficient documentation

## 2022-02-19 DIAGNOSIS — E876 Hypokalemia: Secondary | ICD-10-CM | POA: Insufficient documentation

## 2022-02-19 DIAGNOSIS — Z8249 Family history of ischemic heart disease and other diseases of the circulatory system: Secondary | ICD-10-CM | POA: Insufficient documentation

## 2022-02-19 DIAGNOSIS — I200 Unstable angina: Secondary | ICD-10-CM

## 2022-02-19 DIAGNOSIS — R600 Localized edema: Secondary | ICD-10-CM | POA: Diagnosis not present

## 2022-02-19 DIAGNOSIS — R079 Chest pain, unspecified: Secondary | ICD-10-CM

## 2022-02-19 DIAGNOSIS — E669 Obesity, unspecified: Secondary | ICD-10-CM | POA: Diagnosis not present

## 2022-02-19 DIAGNOSIS — I10 Essential (primary) hypertension: Secondary | ICD-10-CM | POA: Diagnosis not present

## 2022-02-19 DIAGNOSIS — K760 Fatty (change of) liver, not elsewhere classified: Secondary | ICD-10-CM | POA: Insufficient documentation

## 2022-02-19 DIAGNOSIS — R0789 Other chest pain: Secondary | ICD-10-CM | POA: Diagnosis not present

## 2022-02-19 DIAGNOSIS — R609 Edema, unspecified: Secondary | ICD-10-CM | POA: Diagnosis not present

## 2022-02-19 DIAGNOSIS — Z6841 Body Mass Index (BMI) 40.0 and over, adult: Secondary | ICD-10-CM | POA: Diagnosis not present

## 2022-02-19 DIAGNOSIS — Z743 Need for continuous supervision: Secondary | ICD-10-CM | POA: Diagnosis not present

## 2022-02-19 DIAGNOSIS — Z87891 Personal history of nicotine dependence: Secondary | ICD-10-CM | POA: Diagnosis not present

## 2022-02-19 HISTORY — DX: Unstable angina: I20.0

## 2022-02-19 HISTORY — DX: Fatty (change of) liver, not elsewhere classified: K76.0

## 2022-02-19 HISTORY — DX: Chest pain, unspecified: R07.9

## 2022-02-19 HISTORY — DX: Hypokalemia: E87.6

## 2022-02-19 HISTORY — DX: Family history of ischemic heart disease and other diseases of the circulatory system: Z82.49

## 2022-02-27 DIAGNOSIS — Z90710 Acquired absence of both cervix and uterus: Secondary | ICD-10-CM | POA: Diagnosis not present

## 2022-02-27 DIAGNOSIS — Z01411 Encounter for gynecological examination (general) (routine) with abnormal findings: Secondary | ICD-10-CM | POA: Diagnosis not present

## 2022-02-27 DIAGNOSIS — Z1231 Encounter for screening mammogram for malignant neoplasm of breast: Secondary | ICD-10-CM | POA: Diagnosis not present

## 2022-02-27 DIAGNOSIS — R1030 Lower abdominal pain, unspecified: Secondary | ICD-10-CM | POA: Diagnosis not present

## 2022-02-28 ENCOUNTER — Ambulatory Visit: Payer: Medicare HMO | Attending: Cardiology | Admitting: Cardiology

## 2022-02-28 ENCOUNTER — Encounter: Payer: Self-pay | Admitting: Cardiology

## 2022-02-28 VITALS — BP 128/80 | HR 71 | Ht 65.0 in | Wt 236.6 lb

## 2022-02-28 DIAGNOSIS — I209 Angina pectoris, unspecified: Secondary | ICD-10-CM | POA: Diagnosis not present

## 2022-02-28 DIAGNOSIS — I471 Supraventricular tachycardia: Secondary | ICD-10-CM | POA: Diagnosis not present

## 2022-02-28 DIAGNOSIS — I10 Essential (primary) hypertension: Secondary | ICD-10-CM

## 2022-02-28 DIAGNOSIS — R072 Precordial pain: Secondary | ICD-10-CM | POA: Diagnosis not present

## 2022-02-28 HISTORY — DX: Angina pectoris, unspecified: I20.9

## 2022-02-28 HISTORY — DX: Morbid (severe) obesity due to excess calories: E66.01

## 2022-02-28 MED ORDER — NITROGLYCERIN 0.4 MG SL SUBL: 0.4000 mg | SUBLINGUAL_TABLET | SUBLINGUAL | 6 refills | Status: DC | PRN

## 2022-02-28 MED ORDER — ASPIRIN 81 MG PO TBEC: 81.0000 mg | DELAYED_RELEASE_TABLET | Freq: Every day | ORAL | 3 refills | Status: AC

## 2022-02-28 NOTE — Patient Instructions (Addendum)
Medication Instructions:  Your physician has recommended you make the following change in your medication:   Start taking 81 mg coated aspirin daily.  Use nitroglycerin 1 tablet placed under the tongue at the first sign of chest pain or an angina attack. 1 tablet may be used every 5 minutes as needed, for up to 15 minutes. Do not take more than 3 tablets in 15 minutes. If pain persist call 911 or go to the nearest ED.   *If you need a refill on your cardiac medications before your next appointment, please call your pharmacy*   Lab Work: Your physician recommends that you have a BMET today.  If you have labs (blood work) drawn today and your tests are completely normal, you will receive your results only by: [place] (if you have MyChart) OR A paper copy in the mail If you have any lab test that is abnormal or we need to change your treatment, we will call you to review the results.   Testing/Procedures: Your physician has requested that you have cardiac CT. Cardiac computed tomography (CT) is a painless test that uses an x-ray machine to take clear, detailed pictures of your heart. For further information please visit [email or web address]. Please follow instruction sheet as given.    Your Cardiac CT will be scheduled at:   [facility] located off [facility] at the hospital.  Please arrive 30 minutes prior to your appointment time.  You can use the FREE valet parking offered at entrance to outpatient center (encouraged to control the heart rate for the test)   Please follow these instructions carefully (unless otherwise directed):   On the Night Before the Test: Be sure to Drink plenty of water. Do not consume any caffeinated/decaffeinated beverages or chocolate 12 hours prior to your test. Do not take any antihistamines 12 hours prior to your test.  On the Day of the Test: Drink plenty of water until 1 hour prior to the test. Do not eat any food  4 hours prior to the test. No smoking 4 hours prior to test. You may take your regular medications prior to the test.  Take metoprolol (Lopressor) two hours prior to test. Take 3 tablets (75 mg) for a one time dose as your heart rate needs to be lower. HOLD Furosemide morning of the test. FEMALES- please wear underwire-free bra if available, avoid dresses & tight clothing. Wear plain shirt no beads, sparkles, rhinestones, metal or heavy embroidery.  After the Test: Drink plenty of water. After receiving IV contrast, you may experience a mild flushed feeling. This is normal. On occasion, you may experience a mild rash up to 24 hours after the test. This is not dangerous. If this occurs, you can take Benadryl 25 mg and increase your fluid intake. If you experience trouble breathing, this can be serious. If it is severe call 911 IMMEDIATELY. If it is mild, please call our office. If you take any of these medications: Glipizide/Metformin, Avandament, Glucavance, please do not take 48 hours after completing test unless otherwise instructed.  We will call to schedule your test 2-4 weeks out understanding that some insurance companies will need an authorization prior to the service being performed.      Follow-Up: At [facility], you and your health needs are our priority.  As part of our continuing mission to provide you with exceptional heart care, we have created designated Provider Care Teams.  These Care Teams include your primary Cardiologist (physician)  and Advanced Practice Providers (APPs -  Physician Assistants and Nurse Practitioners) who all work together to provide you with the care you need, when you need it.  We recommend signing up for the patient portal called "MyChart".  Sign up information is provided on this After Visit Summary.  MyChart is used to connect with patients for Virtual Visits (Telemedicine).  Patients are able to view lab/test results, encounter notes,  upcoming appointments, etc.  Non-urgent messages can be sent to your provider as well.   To learn more about what you can do with MyChart, go to [email or web address].    Your next appointment:   9 month(s)  The format for your next appointment:   In Person  Provider:   Jyl Heinz, MD    Other Instructions Cardiac CT Angiogram A cardiac CT angiogram is a procedure to look at the heart and the area around the heart. It may be done to help find the cause of chest pains or other symptoms of heart disease. During this procedure, a substance called contrast dye is injected into the blood vessels in the area to be checked. A large X-ray machine, called a CT scanner, then takes detailed pictures of the heart and the surrounding area. The procedure is also sometimes called a coronary CT angiogram, coronary artery scanning, or CTA. A cardiac CT angiogram allows the health care provider to see how well blood is flowing to and from the heart. The health care provider will be able to see if there are any problems, such as: Blockage or narrowing of the coronary arteries in the heart. Fluid around the heart. Signs of weakness or disease in the muscles, valves, and tissues of the heart. Tell a health care provider about: Any allergies you have. This is especially important if you have had a previous allergic reaction to contrast dye. All medicines you are taking, including vitamins, herbs, eye drops, creams, and over-the-counter medicines. Any blood disorders you have. Any surgeries you have had. Any medical conditions you have. Whether you are pregnant or may be pregnant. Any anxiety disorders, chronic pain, or other conditions you have that may increase your stress or prevent you from lying still. What are the risks? Generally, this is a safe procedure. However, problems may occur, including: Bleeding. Infection. Allergic reactions to medicines or dyes. Damage to other structures or  organs. Kidney damage from the contrast dye that is used. Increased risk of cancer from radiation exposure. This risk is low. Talk with your health care provider about: The risks and benefits of testing. How you can receive the lowest dose of radiation. What happens before the procedure? Wear comfortable clothing and remove any jewelry, glasses, dentures, and hearing aids. Follow instructions from your health care provider about eating and drinking. This may include: For 12 hours before the procedure -- avoid caffeine. This includes tea, coffee, soda, energy drinks, and diet pills. Drink plenty of water or other fluids that do not have caffeine in them. Being well hydrated can prevent complications. For 4-6 hours before the procedure -- stop eating and drinking. The contrast dye can cause nausea, but this is less likely if your stomach is empty. Ask your health care provider about changing or stopping your regular medicines. This is especially important if you are taking diabetes medicines, blood thinners, or medicines to treat problems with erections (erectile dysfunction). What happens during the procedure?  Hair on your chest may need to be removed so that small sticky patches called electrodes  can be placed on your chest. These will transmit information that helps to monitor your heart during the procedure. An IV will be inserted into one of your veins. You might be given a medicine to control your heart rate during the procedure. This will help to ensure that good images are obtained. You will be asked to lie on an exam table. This table will slide in and out of the CT machine during the procedure. Contrast dye will be injected into the IV. You might feel warm, or you may get a metallic taste in your mouth. You will be given a medicine called nitroglycerin. This will relax or dilate the arteries in your heart. The table that you are lying on will move into the CT machine tunnel for the  scan. The person running the machine will give you instructions while the scans are being done. You may be asked to: Keep your arms above your head. Hold your breath. Stay very still, even if the table is moving. When the scanning is complete, you will be moved out of the machine. The IV will be removed. The procedure may vary among health care providers and hospitals. What can I expect after the procedure? After your procedure, it is common to have: A metallic taste in your mouth from the contrast dye. A feeling of warmth. A headache from the nitroglycerin. Follow these instructions at home: Take over-the-counter and prescription medicines only as told by your health care provider. If you are told, drink enough fluid to keep your urine pale yellow. This will help to flush the contrast dye out of your body. Most people can return to their normal activities right after the procedure. Ask your health care provider what activities are safe for you. It is up to you to get the results of your procedure. Ask your health care provider, or the department that is doing the procedure, when your results will be ready. Keep all follow-up visits as told by your health care provider. This is important. Contact a health care provider if: You have any symptoms of allergy to the contrast dye. These include: Shortness of breath. Rash or hives. A racing heartbeat. Summary A cardiac CT angiogram is a procedure to look at the heart and the area around the heart. It may be done to help find the cause of chest pains or other symptoms of heart disease. During this procedure, a large X-ray machine, called a CT scanner, takes detailed pictures of the heart and the surrounding area after a contrast dye has been injected into blood vessels in the area. Ask your health care provider about changing or stopping your regular medicines before the procedure. This is especially important if you are taking diabetes  medicines, blood thinners, or medicines to treat erectile dysfunction. If you are told, drink enough fluid to keep your urine pale yellow. This will help to flush the contrast dye out of your body. This information is not intended to replace advice given to you by your health care provider. Make sure you discuss any questions you have with your health care provider. Document Revised: 09/07/2021 Document Reviewed: 01/14/2019 Elsevier Patient Education  [place].  Aspirin and Your Heart Aspirin is a medicine that prevents the platelets in your blood from sticking together. Platelets are the cells that your blood uses for clotting. Aspirin can be used to help reduce the risk of blood clots, heart attacks, and other heart-related problems. What are the risks? Daily use of aspirin  can cause side effects. Some of these include: Bleeding. Bleeding can be minor or serious. An example of minor bleeding is bleeding from a cut, and the bleeding does not stop. An example of more serious bleeding is stomach bleeding or, rarely, bleeding into the brain. Your risk of bleeding increases if you are also taking NSAIDs, such as ibuprofen. Increased bruising. Upset stomach. An allergic reaction. People who have growths inside the nose (nasal polyps) have an increased risk of developing an aspirin allergy. How to use aspirin to care for your heart  Take aspirin only as told by your health care provider. Make sure that you understand how much to take and what form to take. The two forms of aspirin are: Non-enteric-coated.This type of aspirin does not have a coating and is absorbed quickly. This type of aspirin also comes in a chewable form. Enteric-coated. This type of aspirin has a coating that releases the medicine very slowly. Enteric-coated aspirin might cause less stomach upset than non-enteric-coated aspirin. This type of aspirin should not be chewed or crushed. Work with your health care provider to  find out whether it is safe and beneficial for you to take aspirin daily. Taking aspirin daily may be helpful if: You have had a heart attack or chest pain, or you are at risk for a heart attack. You have a condition in which certain heart vessels are blocked (coronary artery disease), and you have had a procedure to treat it. Examples are: Open-heart surgery, such as coronary artery bypass surgery (CABG). Coronary angioplasty,which is done to widen a blood vessel of your heart. Having a small mesh tube, or stent, placed in your coronary artery. You have had certain types of stroke or a mini-stroke known as a transient ischemic attack (TIA). You have a narrowing of the arteries that supply the limbs (peripheral vascular disease, or PVD). You have long-term (chronic) heart rhythm problems, such as atrial fibrillation, and your health care provider thinks aspirin may help. You have valve disease, have had a heart valve replacement, or have had surgery on a valve. You are considered at increased risk of developing coronary artery disease or PVD. Follow these instructions at home Medicines Take over-the-counter and prescription medicines only as told by your health care provider. If you are taking blood thinners: Talk with your health care provider before you take any medicines that contain aspirin or NSAIDs, such as ibuprofen. These medicines increase your risk for dangerous bleeding. Take your medicine exactly as told, at the same time every day. Avoid activities that could cause injury or bruising, and follow instructions about how to prevent falls. Wear a medical alert bracelet or carry a card that lists what medicines you take. General instructions Do not drink alcohol if: Your health care provider tells you not to drink. You are pregnant, may be pregnant, or are planning to become pregnant. If you drink alcohol: Limit how much you have to: 0-1 drink a day for women. 0-2 drinks a day for  men. Know how much alcohol is in your drink. In the [place] one drink equals one 12 oz bottle of beer (355 mL), one 5 oz glass of wine (148 mL), or one 1 oz glass of hard liquor (44 mL). Keep all follow-up visits. This is important. Where to find more information The American Heart Association: www.heart.org The Centers for Disease Control and Prevention: http://www.wolf.info/ Contact a health care provider if: You have unusual bleeding or bruising. You have stomach pain or you feel  nauseous. You have ringing in your ears. You have an allergic reaction that causes hives, itchy skin, or swelling of the lips, tongue, or face. Get help right away if: Your bowel movements are bloody, dark red, or black. You vomit or cough up blood. You have blood in your urine. You have a cough, make high-pitched whistling sounds most often heard when you breathe out (wheeze), or feel short of breath. You have chest pain, especially if the pain spreads to your arms, back, neck, or jaw. You have any symptoms of a stroke. "BE FAST" is an easy way to remember the main warning signs of a stroke: B - Balance. Signs are dizziness, sudden trouble walking, or loss of balance. E - Eyes. Signs are trouble seeing or a sudden change in vision. F - Face. Signs are sudden weakness or numbness of the face, or the face or eyelid drooping on one side. A - Arms. Signs are weakness or numbness in an arm. This happens suddenly and usually on one side of the body. S - Speech. Signs are sudden trouble speaking, slurred speech, or trouble understanding what people say. T - Time. Time to call emergency services. Write down what time symptoms started. You have other signs of a stroke, such as: A sudden, severe headache with no known cause. Confusion. Nausea or vomiting. Seizure. These symptoms may represent a serious problem that is an emergency. Do not wait to see if the symptoms will go away. Get medical help right away. Call your local  emergency services (911 in the [place] Do not drive yourself to the hospital. Summary Aspirin use can help reduce the risk of blood clots, heart attacks, and other heart-related problems. Daily use of aspirin can cause side effects. Take aspirin only as told by your health care provider. Make sure that you understand how much to take and what form to take. Your health care provider will help you determine whether it is safe and beneficial for you to take aspirin daily. This information is not intended to replace advice given to you by your health care provider. Make sure you discuss any questions you have with your health care provider. Document Revised: 07/23/2020 Document Reviewed: 07/23/2020 Elsevier Patient Education  [place].  Nitroglycerin Sublingual Tablets What is this medication? NITROGLYCERIN (nye troe GLI ser in) prevents and treats chest pain (angina). It works by relaxing blood vessels, which decreases the amount of work the heart has to do. It belongs to a group of medications called nitrates. This medicine may be used for other purposes; ask your health care provider or pharmacist if you have questions. COMMON BRAND NAME(S): Nitroquick, Nitrostat, Nitrotab What should I tell my care team before I take this medication? They need to know if you have any of these conditions: Anemia Head injury, recent stroke, or bleeding in the brain Liver disease Previous heart attack An unusual or allergic reaction to nitroglycerin, other medications, foods, dyes, or preservatives Pregnant or trying to get pregnant Breast-feeding How should I use this medication? Take this medication by mouth as needed. Use at the first sign of an angina attack (chest pain or tightness). You can also take this medication 5 to 10 minutes before an event likely to produce chest pain. Follow the directions exactly as written on the prescription label. Place one tablet under your tongue and let it  dissolve. Do not swallow whole. Replace the dose if you accidentally swallow it. It will help if your mouth is not  dry. Saliva around the tablet will help it to dissolve more quickly. Do not eat or drink, smoke or chew tobacco while a tablet is dissolving. Sit down when taking this medication. In an angina attack, you should feel better within 5 minutes after your first dose. You can take a dose every 5 minutes up to a total of 3 doses. If you do not feel better or feel worse after 1 dose, call 9-1-1 at once. Do not take more than 3 doses in 15 minutes. Your care team might give you other directions. Follow those directions if they do. Do not take your medication more often than directed. Talk to your care team about the use of this medication in children. Special care may be needed. Overdosage: If you think you have taken too much of this medicine contact a poison control center or emergency room at once. NOTE: This medicine is only for you. Do not share this medicine with others. What if I miss a dose? This does not apply. This medication is only used as needed. What may interact with this medication? Do not take this medication with any of the following: Certain migraine medications like ergotamine and dihydroergotamine (DHE) Medications used to treat erectile dysfunction like sildenafil, tadalafil, and vardenafil Riociguat This medication may also interact with the following: Alteplase Aspirin Heparin Medications for high blood pressure Medications for mental depression Other medications used to treat angina Phenothiazines like chlorpromazine, mesoridazine, prochlorperazine, thioridazine This list may not describe all possible interactions. Give your health care provider a list of all the medicines, herbs, non-prescription drugs, or dietary supplements you use. Also tell them if you smoke, drink alcohol, or use illegal drugs. Some items may interact with your medicine. What should I watch  for while using this medication? Tell your care team if you feel your medication is no longer working. Keep this medication with you at all times. Sit or lie down when you take your medication to prevent falling if you feel dizzy or faint after using it. Try to remain calm. This will help you to feel better faster. If you feel dizzy, take several deep breaths and lie down with your feet propped up, or bend forward with your head resting between your knees. You may get drowsy or dizzy. Do not drive, use machinery, or do anything that needs mental alertness until you know how this medication affects you. Do not stand or sit up quickly, especially if you are an older patient. This reduces the risk of dizzy or fainting spells. Alcohol can make you more drowsy and dizzy. Avoid alcoholic drinks. Do not treat yourself for coughs, colds, or pain while you are taking this medication without asking your care team for advice. Some ingredients may increase your blood pressure. What side effects may I notice from receiving this medication? Side effects that you should report to your care team as soon as possible: Allergic reactions--skin rash, itching, hives, swelling of the face, lips, tongue, or throat Headache, unusual weakness or fatigue, shortness of breath, nausea, vomiting, rapid heartbeat, blue skin or lips, which may be signs of methemoglobinemia Increased pressure around the brain--severe headache, blurry vision, change in vision, nausea, vomiting Low blood pressure--dizziness, feeling faint or lightheaded, blurry vision Slow heartbeat--dizziness, feeling faint or lightheaded, confusion, trouble breathing, unusual weakness or fatigue Worsening chest pain (angina)--pain, pressure, or tightness in the chest, neck, back, or arms Side effects that usually do not require medical attention (report to your care team if they continue  or are bothersome): Dizziness Flushing Headache This list may not describe  all possible side effects. Call your doctor for medical advice about side effects. You may report side effects to FDA at 1-800-FDA-1088. Where should I keep my medication? Keep out of the reach of children. Store at room temperature between 20 and 25 degrees C (68 and 77 degrees F). Store in [facility] [employer]. Protect from light and moisture. Keep tightly closed. Throw away any unused medication after the expiration date. NOTE: This sheet is a summary. It may not cover all possible information. If you have questions about this medicine, talk to your doctor, pharmacist, or health care provider.  2023 Elsevier/Gold Standard (2007-07-12 00:00:00)   Important Information About Sugar

## 2022-02-28 NOTE — Progress Notes (Signed)
Cardiology Office Note:    Date:  02/28/2022   ID:  Kristy Keller, DOB 10/24/1960, MRN [id number]  PCP:  Cassandria Anger, MD  Cardiologist:  Jenean Lindau, MD   Referring MD: Malachy Moan, MD    ASSESSMENT:    1. Benign essential hypertension   2. SVT (supraventricular tachycardia) ([place])   3. Angina pectoris ([place])   4. Morbid obesity ([place])    PLAN:    In order of problems listed above:  Primary prevention stressed with the patient.  Importance of compliance with diet medication stressed and she vocalized understanding. Essential hypertension: Blood pressure is stable and diet was emphasized.  Lifestyle modification urged. Obesity: Weight reduction stressed and diet was emphasized.  Risks of obesity explained and she promises to do better. History of SVT: We will evaluate this after the below mentioned evaluation. Angina pectoris: Patient's symptoms are concerning.  Following recommendations were made.  She was advised to take a coated baby aspirin daily her tests which is the CT scan was completed.  Sublingual nitroglycerin prescription was sent, its protocol and 911 protocol explained and the patient vocalized understanding questions were answered to the patient's satisfaction.  In view of this I explained coronary angiography conventional and CT FFR.  She prefers the latter and respect her wishes.  Further recommendations will be made based on the findings of this test.  Treatment for SVT with antiarrhythmic agents will also be better strategized after the coronary anatomy is understood. Patient will be seen in follow-up appointment in 6 months or earlier if the patient has any concerns    Medication Adjustments/Labs and Tests Ordered: Current medicines are reviewed at length with the patient today.  Concerns regarding medicines are outlined above.  No orders of the defined types were placed in this encounter.  No orders of the defined types were placed in this  encounter.    History of Present Illness:    Kristy Keller is a 61 y.o. female who is being seen today for the evaluation of chest pain and palpitations at the request of Malachy Moan, MD. patient is a pleasant 61 year old female.  She has past medical history of essential hypertension, obesity and leads a sedentary lifestyle.  She denies any history of mixed dyslipidemia or diabetes mellitus.  She tells me that she has chest tightness at times.  She had a stress test recently which she tells me was unremarkable.  She has substernal chest tightness going to the left arm and the neck.  She tells me the stress test was fine.  Sublingual nitroglycerin apparently has helped her chest pain.  At the time of my evaluation, the patient is alert awake oriented and in no distress.  She also has palpitations.  Her ejection fraction is normal and she has had a history of SVT.  Past Medical History:  Diagnosis Date   Age-related nuclear cataract, bilateral 06/04/2021   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 Asymptomatic, with borderline but controlled blood pressure. Advised to continue monitoring her blood pressure, and to continue with dietary modifications. No pharmacological changes to her management at this visit.   Breast lump 02/21/2018   Cancer of skin, squamous cell 03/12/2019   Formatting of this note might be different from the original. possible right peri orbital   Cat bite of hand, right, sequela 12/21/2020   Cellulitis  of right hand 12/08/2020   Formatting of this note might be different from the original. Added automatically from request for surgery [id number]   Chicken pox    Chronic venous insufficiency 11/07/2017   2019 B Support socks knee high Elevate legs Low salt diet Loose 10-20 lbs Spironolactone   Class 2 severe obesity due to excess calories with  serious comorbidity in adult [facility]) 10/06/2019   Classic migraine with aura 03/12/2019   Edema 06/05/2017   Due to gabapentin - resolved off Rx 11/18  Spironolactone   Family history of early CAD 03/05/2017   Baby ASA qd   Gallbladder pain 02/21/2018   Gallstone 03/05/2017   2014 large 2018 [place]: Cholelithiasis with confluence of gallstones measuring 2.5 cm in length, similar to prior study. No gallbladder wall thickening or pericholecystic fluid   Hypermetropia of both eyes 03/12/2019   Infected sebaceous cyst 07/19/2017   2/19 back   Insomnia 12/30/2017   2019 refractory Zolpidem prn - not taking Zolpidem  Potential benefits of a long term benzodiazepines  use as well as potential risks  and complications were explained to the patient and were aknowledged.     Localized swelling, mass, or lump of lower extremity, bilateral 01/17/2022   Last Assessment & Plan:  Formatting of this note might be different from the original. The symmetric swelling and erythema, as well has her orthopnea, point towards a cardiac etiology as opposed to deep venous thrombosis. Worsening chronic venous insufficiency is possibly the sole cause of this, and at least a contributory etiology.  CBC, BMP, and pro-BNP ordered. Prescription for 40 mg of Lasix,    Multilevel lumbosacral spondylosis with radiculopathy 04/12/2017   Nodule of left external ear 10/23/2021   Last Assessment & Plan:  Formatting of this note might be different from the original. Benign examination; possibly an area of thickened skin due to friction, dryness, and repeated manipulation by the patient. Advised she avoid alcohol application and use of neosporin. Suggested she try moisturizer to avoid dryness and further irritation.   Nuclear sclerotic cataract of both eyes 03/12/2019   OAG (open angle glaucoma) suspect, low risk, bilateral 03/12/2019   Orthopnea 01/17/2022   Last Assessment & Plan:  Formatting of this note might be different from the original. Her cardiac  workup in the hospital was reassuring, though her orthopnea and increased swelling in her extremities are concerning. Her lung exam is reassuring, but in consideration of recent events, a plain radiograph of the chest, CBC, BMP, and pro-BNP have been ordered.  Increasing her Lasix to 40 mg twice dail   Pasteurella cellulitis due to cat bite 12/21/2020   Postlaminectomy syndrome of lumbar region 05/19/2021   Formatting of this note might be different from the original. Added automatically from request for surgery [id number]   Preop exam for internal medicine 03/05/2017   [name] is medically clear for her L4 decompression/fusion. Thank you! EKG - nl Labs, CXR - OK Stress test discussed. [name] declined. She was very active prior to her radiculopathy w/o CPs Baby ASA qd   Presbyopia 03/12/2019   Pyogenic arthritis of hand ([place]) 12/21/2020   Formatting of this note might be different from the original. Added automatically from request for surgery [id number]   Radiculopathy of leg 03/06/2017   01/2017 Dr Sherlyn Lick  LBP - s/p L4 decompr and fusion surgery Apr 10, 2017. LBP and RLE pain is better Flexeril Loose weight Join a gentle yoga class   Regular astigmatism of both eyes  03/12/2019   Rupture of extensor tendon of right hand 12/09/2020   Formatting of this note might be different from the original. S/p repair Dr Kary Kos 12-09-2020   Spinal stenosis of lumbar region with neurogenic claudication 06/07/2021   SVT (supraventricular tachycardia) ([place]) 11/17/2019   Last Assessment & Plan:  Formatting of this note might be different from the original. Examination suggests she's experiencing supraventricular tachycardia again. Attempted a carotid sinus massage, after explaining the process and obtaining verbal consent, of each side for 10-15 seconds. Failure to convert her back to normal rate despite multiple attempts.   Advised that she'd have to go to the ED    Past Surgical History:  Procedure Laterality Date   ABDOMINAL  HYSTERECTOMY     partial    Current Medications: Current Meds  Medication Sig   acetaminophen (TYLENOL) 500 MG tablet Take 500 mg by mouth every 5 (five) hours as needed for headache.   Cholecalciferol (VITAMIN D3) 2000 units capsule Take 1 capsule (2,000 Units total) by mouth daily.   doxycycline (VIBRA-TABS) 100 MG tablet Take 100 mg by mouth 2 (two) times daily.   furosemide (LASIX) 40 MG tablet Take 40 mg by mouth 2 (two) times daily.   metoprolol tartrate (LOPRESSOR) 50 MG tablet Take 25 mg by mouth 2 (two) times daily.   spironolactone (ALDACTONE) 25 MG tablet Take 1 tablet (25 mg total) by mouth daily. (Patient taking differently: Take 25 mg by mouth 2 (two) times daily.)     Allergies:   Ketorolac, Gabapentin, Lasix [furosemide], and Lyrica [pregabalin]   Social History   Socioeconomic History   Marital status: Married    Spouse name: Not on file   Number of children: Not on file   Years of education: Not on file   Highest education level: Not on file  Occupational History   Not on file  Tobacco Use   Smoking status: Former   Smokeless tobacco: Never  Substance and Sexual Activity   Alcohol use: No   Drug use: No   Sexual activity: Never    Partners: Male  Other Topics Concern   Not on file  Social History Narrative   Not on file   Social Determinants of Health   Financial Resource Strain: Not on file  Food Insecurity: Not on file  Transportation Needs: Not on file  Physical Activity: Not on file  Stress: Not on file  Social Connections: Not on file     Family History: The patient's family history includes Cancer (age of onset: 104) in her father; Diabetes in her sister; Heart disease (age of onset: 87) in her brother; Heart disease (age of onset: 64) in her mother.  ROS:   Please see the history of present illness.    All other systems reviewed and are negative.  EKGs/Labs/Other Studies Reviewed:    The following studies were reviewed today: EKG  reveals sinus rhythm and nonspecific ST-T changes   Recent Labs: No results found for requested labs within last 365 days.  Recent Lipid Panel    Component Value Date/Time   CHOL 133 03/07/2017 1333   TRIG 73.0 03/07/2017 1333   HDL 47.70 03/07/2017 1333   CHOLHDL 3 03/07/2017 1333   VLDL 14.6 03/07/2017 1333   LDLCALC 71 03/07/2017 1333    Physical Exam:    VS:  BP 128/80   Pulse 71   Ht '5\' 5"'$  (1.651 m)   Wt 236 lb 9.6 oz (107.3 kg)   SpO2  96%   BMI 39.37 kg/m     Wt Readings from Last 3 Encounters:  02/28/22 236 lb 9.6 oz (107.3 kg)  05/14/18 223 lb (101.2 kg)  02/11/18 216 lb (98 kg)     GEN: Patient is in no acute distress HEENT: Normal NECK: No JVD; No carotid bruits LYMPHATICS: No lymphadenopathy CARDIAC: S1 S2 regular, 2/6 systolic murmur at the apex. RESPIRATORY:  Clear to auscultation without rales, wheezing or rhonchi  ABDOMEN: Soft, non-tender, non-distended MUSCULOSKELETAL:  No edema; No deformity  SKIN: Warm and dry NEUROLOGIC:  Alert and oriented x 3 PSYCHIATRIC:  Normal affect    Signed, Jenean Lindau, MD  02/28/2022 8:43 AM    [place]

## 2022-03-01 LAB — BASIC METABOLIC PANEL
BUN/Creatinine Ratio: 23 (ref 12–28)
BUN: 22 mg/dL (ref 8–27)
CO2: 24 mmol/L (ref 20–29)
Calcium: 9.6 mg/dL (ref 8.7–10.3)
Chloride: 100 mmol/L (ref 96–106)
Creatinine, Ser: 0.97 mg/dL (ref 0.57–1.00)
Glucose: 120 mg/dL — ABNORMAL HIGH (ref 70–99)
Potassium: 4 mmol/L (ref 3.5–5.2)
Sodium: 140 mmol/L (ref 134–144)
eGFR: 66 mL/min/{1.73_m2} (ref >59)

## 2022-03-08 DIAGNOSIS — Z1231 Encounter for screening mammogram for malignant neoplasm of breast: Secondary | ICD-10-CM | POA: Diagnosis not present

## 2022-03-14 ENCOUNTER — Telehealth (HOSPITAL_COMMUNITY): Payer: Self-pay | Admitting: Emergency Medicine

## 2022-03-14 NOTE — Telephone Encounter (Signed)
Reaching out to patient to offer assistance regarding upcoming cardiac imaging study; pt verbalizes understanding of appt date/time, parking situation and where to check in, pre-test NPO status and medications ordered, and verified current allergies; name and call back number provided for further questions should they arise Kristy Bond RN Navigator Cardiac Imaging [facility] [phone] office [phone] cell  Arrival 800, w/c entrance '50mg'$  metoprolol tartrate Difficult IV

## 2022-03-16 ENCOUNTER — Ambulatory Visit (HOSPITAL_COMMUNITY)
Admission: RE | Admit: 2022-03-16 | Discharge: 2022-03-16 | Disposition: A | Discharge status: Home / Self Care | Payer: Medicare HMO | Source: Ambulatory Visit | Attending: Cardiology | Admitting: Cardiology

## 2022-03-16 DIAGNOSIS — I209 Angina pectoris, unspecified: Secondary | ICD-10-CM

## 2022-03-16 DIAGNOSIS — R072 Precordial pain: Secondary | ICD-10-CM | POA: Diagnosis not present

## 2022-03-16 MED ORDER — NITROGLYCERIN 0.4 MG SL SUBL
SUBLINGUAL_TABLET | SUBLINGUAL | Status: AC
  Filled 2022-03-16: qty 2

## 2022-03-16 MED ORDER — IOHEXOL 350 MG/ML SOLN
100.0000 mL | Freq: Once | INTRAVENOUS | Status: AC | PRN
  Administered 2022-03-16: 100 mL via INTRAVENOUS

## 2022-03-16 MED ORDER — NITROGLYCERIN 0.4 MG SL SUBL
0.8000 mg | SUBLINGUAL_TABLET | Freq: Once | SUBLINGUAL | Status: AC
  Administered 2022-03-16: 0.8 mg via SUBLINGUAL

## 2022-03-22 DIAGNOSIS — N83291 Other ovarian cyst, right side: Secondary | ICD-10-CM | POA: Diagnosis not present

## 2022-03-22 DIAGNOSIS — Z90710 Acquired absence of both cervix and uterus: Secondary | ICD-10-CM | POA: Diagnosis not present

## 2022-03-22 DIAGNOSIS — N83201 Unspecified ovarian cyst, right side: Secondary | ICD-10-CM | POA: Diagnosis not present

## 2022-03-22 DIAGNOSIS — Z888 Allergy status to other drugs, medicaments and biological substances status: Secondary | ICD-10-CM | POA: Diagnosis not present

## 2022-03-22 DIAGNOSIS — R1030 Lower abdominal pain, unspecified: Secondary | ICD-10-CM | POA: Diagnosis not present

## 2022-03-29 DIAGNOSIS — H0012 Chalazion right lower eyelid: Secondary | ICD-10-CM | POA: Diagnosis not present

## 2022-03-29 DIAGNOSIS — H25043 Posterior subcapsular polar age-related cataract, bilateral: Secondary | ICD-10-CM | POA: Diagnosis not present

## 2022-03-29 DIAGNOSIS — H18523 Epithelial (juvenile) corneal dystrophy, bilateral: Secondary | ICD-10-CM | POA: Diagnosis not present

## 2022-03-29 DIAGNOSIS — H40013 Open angle with borderline findings, low risk, bilateral: Secondary | ICD-10-CM | POA: Diagnosis not present

## 2022-03-29 DIAGNOSIS — H2513 Age-related nuclear cataract, bilateral: Secondary | ICD-10-CM | POA: Diagnosis not present

## 2022-03-29 DIAGNOSIS — H524 Presbyopia: Secondary | ICD-10-CM | POA: Diagnosis not present

## 2022-03-29 DIAGNOSIS — H0015 Chalazion left lower eyelid: Secondary | ICD-10-CM | POA: Diagnosis not present

## 2022-03-29 DIAGNOSIS — H5213 Myopia, bilateral: Secondary | ICD-10-CM | POA: Diagnosis not present

## 2022-03-29 DIAGNOSIS — H52203 Unspecified astigmatism, bilateral: Secondary | ICD-10-CM | POA: Diagnosis not present

## 2022-04-18 DIAGNOSIS — H18523 Epithelial (juvenile) corneal dystrophy, bilateral: Secondary | ICD-10-CM | POA: Diagnosis not present

## 2022-04-18 DIAGNOSIS — H2513 Age-related nuclear cataract, bilateral: Secondary | ICD-10-CM | POA: Diagnosis not present

## 2022-04-18 DIAGNOSIS — H52223 Regular astigmatism, bilateral: Secondary | ICD-10-CM | POA: Diagnosis not present

## 2022-04-18 DIAGNOSIS — H5371 Glare sensitivity: Secondary | ICD-10-CM | POA: Diagnosis not present

## 2022-04-18 DIAGNOSIS — H5203 Hypermetropia, bilateral: Secondary | ICD-10-CM | POA: Diagnosis not present

## 2022-04-18 DIAGNOSIS — R0601 Orthopnea: Secondary | ICD-10-CM | POA: Diagnosis not present

## 2022-04-18 DIAGNOSIS — M961 Postlaminectomy syndrome, not elsewhere classified: Secondary | ICD-10-CM | POA: Diagnosis not present

## 2022-04-18 DIAGNOSIS — H524 Presbyopia: Secondary | ICD-10-CM | POA: Diagnosis not present

## 2022-04-18 DIAGNOSIS — H25043 Posterior subcapsular polar age-related cataract, bilateral: Secondary | ICD-10-CM | POA: Diagnosis not present

## 2022-05-04 HISTORY — PX: EYE SURGERY: SHX253

## 2022-05-07 DIAGNOSIS — H2511 Age-related nuclear cataract, right eye: Secondary | ICD-10-CM | POA: Diagnosis not present

## 2022-05-07 DIAGNOSIS — H25041 Posterior subcapsular polar age-related cataract, right eye: Secondary | ICD-10-CM | POA: Diagnosis not present

## 2022-05-07 DIAGNOSIS — H25811 Combined forms of age-related cataract, right eye: Secondary | ICD-10-CM | POA: Diagnosis not present

## 2022-05-07 DIAGNOSIS — H2512 Age-related nuclear cataract, left eye: Secondary | ICD-10-CM | POA: Diagnosis not present

## 2022-05-15 DIAGNOSIS — H2512 Age-related nuclear cataract, left eye: Secondary | ICD-10-CM | POA: Diagnosis not present

## 2022-05-15 DIAGNOSIS — H25042 Posterior subcapsular polar age-related cataract, left eye: Secondary | ICD-10-CM | POA: Diagnosis not present

## 2022-05-21 DIAGNOSIS — H25042 Posterior subcapsular polar age-related cataract, left eye: Secondary | ICD-10-CM | POA: Diagnosis not present

## 2022-05-21 DIAGNOSIS — H2512 Age-related nuclear cataract, left eye: Secondary | ICD-10-CM | POA: Diagnosis not present

## 2022-05-21 DIAGNOSIS — H25812 Combined forms of age-related cataract, left eye: Secondary | ICD-10-CM | POA: Diagnosis not present

## 2022-05-25 DIAGNOSIS — Z823 Family history of stroke: Secondary | ICD-10-CM | POA: Diagnosis not present

## 2022-05-25 DIAGNOSIS — Z87891 Personal history of nicotine dependence: Secondary | ICD-10-CM | POA: Diagnosis not present

## 2022-05-25 DIAGNOSIS — Z809 Family history of malignant neoplasm, unspecified: Secondary | ICD-10-CM | POA: Diagnosis not present

## 2022-05-25 DIAGNOSIS — I471 Supraventricular tachycardia, unspecified: Secondary | ICD-10-CM | POA: Diagnosis not present

## 2022-05-25 DIAGNOSIS — Z811 Family history of alcohol abuse and dependence: Secondary | ICD-10-CM | POA: Diagnosis not present

## 2022-05-25 DIAGNOSIS — R609 Edema, unspecified: Secondary | ICD-10-CM | POA: Diagnosis not present

## 2022-05-25 DIAGNOSIS — Z888 Allergy status to other drugs, medicaments and biological substances status: Secondary | ICD-10-CM | POA: Diagnosis not present

## 2022-05-25 DIAGNOSIS — Z6838 Body mass index (BMI) 38.0-38.9, adult: Secondary | ICD-10-CM | POA: Diagnosis not present

## 2022-05-25 DIAGNOSIS — I10 Essential (primary) hypertension: Secondary | ICD-10-CM | POA: Diagnosis not present

## 2022-05-25 DIAGNOSIS — Z885 Allergy status to narcotic agent status: Secondary | ICD-10-CM | POA: Diagnosis not present

## 2022-05-25 DIAGNOSIS — Z833 Family history of diabetes mellitus: Secondary | ICD-10-CM | POA: Diagnosis not present

## 2022-05-29 DIAGNOSIS — H0012 Chalazion right lower eyelid: Secondary | ICD-10-CM | POA: Diagnosis not present

## 2022-06-17 DIAGNOSIS — R69 Illness, unspecified: Secondary | ICD-10-CM | POA: Diagnosis not present

## 2022-06-20 DIAGNOSIS — N83201 Unspecified ovarian cyst, right side: Secondary | ICD-10-CM | POA: Diagnosis not present

## 2022-06-20 DIAGNOSIS — L989 Disorder of the skin and subcutaneous tissue, unspecified: Secondary | ICD-10-CM | POA: Diagnosis not present

## 2022-06-20 DIAGNOSIS — Z90710 Acquired absence of both cervix and uterus: Secondary | ICD-10-CM | POA: Diagnosis not present

## 2022-06-20 DIAGNOSIS — R197 Diarrhea, unspecified: Secondary | ICD-10-CM | POA: Diagnosis not present

## 2022-07-15 DIAGNOSIS — Z961 Presence of intraocular lens: Secondary | ICD-10-CM

## 2022-07-15 HISTORY — DX: Presence of intraocular lens: Z96.1

## 2022-07-17 DIAGNOSIS — R197 Diarrhea, unspecified: Secondary | ICD-10-CM | POA: Diagnosis not present

## 2022-07-17 DIAGNOSIS — K5900 Constipation, unspecified: Secondary | ICD-10-CM | POA: Diagnosis not present

## 2022-07-19 DIAGNOSIS — L918 Other hypertrophic disorders of the skin: Secondary | ICD-10-CM | POA: Diagnosis not present

## 2022-07-19 DIAGNOSIS — L304 Erythema intertrigo: Secondary | ICD-10-CM | POA: Diagnosis not present

## 2022-07-19 DIAGNOSIS — L821 Other seborrheic keratosis: Secondary | ICD-10-CM | POA: Diagnosis not present

## 2022-07-24 DIAGNOSIS — K5900 Constipation, unspecified: Secondary | ICD-10-CM | POA: Diagnosis not present

## 2022-07-24 DIAGNOSIS — Z884 Allergy status to anesthetic agent status: Secondary | ICD-10-CM | POA: Diagnosis not present

## 2022-07-24 DIAGNOSIS — M1990 Unspecified osteoarthritis, unspecified site: Secondary | ICD-10-CM | POA: Diagnosis not present

## 2022-07-24 DIAGNOSIS — Z87892 Personal history of anaphylaxis: Secondary | ICD-10-CM | POA: Diagnosis not present

## 2022-07-24 DIAGNOSIS — Z87891 Personal history of nicotine dependence: Secondary | ICD-10-CM | POA: Diagnosis not present

## 2022-07-24 DIAGNOSIS — Z6839 Body mass index (BMI) 39.0-39.9, adult: Secondary | ICD-10-CM | POA: Diagnosis not present

## 2022-07-24 DIAGNOSIS — I509 Heart failure, unspecified: Secondary | ICD-10-CM | POA: Diagnosis not present

## 2022-07-24 DIAGNOSIS — I110 Hypertensive heart disease with heart failure: Secondary | ICD-10-CM | POA: Diagnosis not present

## 2022-07-24 DIAGNOSIS — Z885 Allergy status to narcotic agent status: Secondary | ICD-10-CM | POA: Diagnosis not present

## 2022-07-24 DIAGNOSIS — I471 Supraventricular tachycardia, unspecified: Secondary | ICD-10-CM | POA: Diagnosis not present

## 2022-07-24 DIAGNOSIS — I2510 Atherosclerotic heart disease of native coronary artery without angina pectoris: Secondary | ICD-10-CM | POA: Diagnosis not present

## 2022-09-04 DIAGNOSIS — L821 Other seborrheic keratosis: Secondary | ICD-10-CM

## 2022-09-04 DIAGNOSIS — L304 Erythema intertrigo: Secondary | ICD-10-CM

## 2022-09-04 DIAGNOSIS — Z90710 Acquired absence of both cervix and uterus: Secondary | ICD-10-CM

## 2022-09-04 DIAGNOSIS — M545 Low back pain, unspecified: Secondary | ICD-10-CM

## 2022-09-04 HISTORY — DX: Low back pain, unspecified: M54.50

## 2022-09-04 HISTORY — DX: Other seborrheic keratosis: L82.1

## 2022-09-04 HISTORY — DX: Acquired absence of both cervix and uterus: Z90.710

## 2022-09-04 HISTORY — DX: Erythema intertrigo: L30.4

## 2022-09-06 DIAGNOSIS — R102 Pelvic and perineal pain: Secondary | ICD-10-CM | POA: Diagnosis not present

## 2022-09-06 DIAGNOSIS — R350 Frequency of micturition: Secondary | ICD-10-CM | POA: Diagnosis not present

## 2022-09-06 DIAGNOSIS — N951 Menopausal and female climacteric states: Secondary | ICD-10-CM | POA: Diagnosis not present

## 2022-09-06 DIAGNOSIS — R1909 Other intra-abdominal and pelvic swelling, mass and lump: Secondary | ICD-10-CM | POA: Diagnosis not present

## 2022-09-06 DIAGNOSIS — K439 Ventral hernia without obstruction or gangrene: Secondary | ICD-10-CM | POA: Diagnosis not present

## 2022-09-13 DIAGNOSIS — Z6841 Body Mass Index (BMI) 40.0 and over, adult: Secondary | ICD-10-CM | POA: Diagnosis not present

## 2022-09-13 DIAGNOSIS — Z780 Asymptomatic menopausal state: Secondary | ICD-10-CM | POA: Diagnosis not present

## 2022-09-13 DIAGNOSIS — I200 Unstable angina: Secondary | ICD-10-CM | POA: Diagnosis not present

## 2022-09-13 DIAGNOSIS — R252 Cramp and spasm: Secondary | ICD-10-CM | POA: Diagnosis not present

## 2022-09-13 DIAGNOSIS — Z0001 Encounter for general adult medical examination with abnormal findings: Secondary | ICD-10-CM | POA: Diagnosis not present

## 2022-09-13 DIAGNOSIS — Z7982 Long term (current) use of aspirin: Secondary | ICD-10-CM | POA: Diagnosis not present

## 2022-09-13 DIAGNOSIS — I471 Supraventricular tachycardia, unspecified: Secondary | ICD-10-CM | POA: Diagnosis not present

## 2022-09-24 ENCOUNTER — Telehealth: Payer: Self-pay | Admitting: Gastroenterology

## 2022-09-24 NOTE — Telephone Encounter (Signed)
Good morning Dr. Lavon Paganini  The following patient was referred to [place] for abdominal pain with diarrhea,ventral hernia. She had an office visit to set up for a colonoscopy with [facility]. She said that the prep she was given made her sick and she cancelled the appointment and no longer went back. The referred provider suggested you for her symptoms. Her records are in Care Everywhere. Please review and advise of scheduling. Thank you.

## 2022-09-25 NOTE — Telephone Encounter (Signed)
Okay, please schedule next available appointment.  Thank you

## 2022-09-26 ENCOUNTER — Encounter: Payer: Self-pay | Admitting: Gastroenterology

## 2022-09-28 DIAGNOSIS — Z6841 Body Mass Index (BMI) 40.0 and over, adult: Secondary | ICD-10-CM | POA: Diagnosis not present

## 2022-09-28 DIAGNOSIS — Z79899 Other long term (current) drug therapy: Secondary | ICD-10-CM | POA: Diagnosis not present

## 2022-09-28 DIAGNOSIS — Z0001 Encounter for general adult medical examination with abnormal findings: Secondary | ICD-10-CM | POA: Diagnosis not present

## 2022-09-28 DIAGNOSIS — I110 Hypertensive heart disease with heart failure: Secondary | ICD-10-CM | POA: Diagnosis not present

## 2022-09-28 DIAGNOSIS — R0602 Shortness of breath: Secondary | ICD-10-CM | POA: Diagnosis not present

## 2022-09-28 DIAGNOSIS — R011 Cardiac murmur, unspecified: Secondary | ICD-10-CM | POA: Diagnosis not present

## 2022-09-28 DIAGNOSIS — R221 Localized swelling, mass and lump, neck: Secondary | ICD-10-CM | POA: Diagnosis not present

## 2022-09-28 DIAGNOSIS — R109 Unspecified abdominal pain: Secondary | ICD-10-CM | POA: Diagnosis not present

## 2022-10-05 DIAGNOSIS — Z961 Presence of intraocular lens: Secondary | ICD-10-CM | POA: Diagnosis not present

## 2022-10-05 DIAGNOSIS — H04123 Dry eye syndrome of bilateral lacrimal glands: Secondary | ICD-10-CM | POA: Diagnosis not present

## 2022-10-05 DIAGNOSIS — H40013 Open angle with borderline findings, low risk, bilateral: Secondary | ICD-10-CM | POA: Diagnosis not present

## 2022-10-05 DIAGNOSIS — H18523 Epithelial (juvenile) corneal dystrophy, bilateral: Secondary | ICD-10-CM | POA: Diagnosis not present

## 2022-10-11 DIAGNOSIS — R221 Localized swelling, mass and lump, neck: Secondary | ICD-10-CM | POA: Diagnosis not present

## 2022-10-11 DIAGNOSIS — E042 Nontoxic multinodular goiter: Secondary | ICD-10-CM | POA: Diagnosis not present

## 2022-10-11 DIAGNOSIS — R1310 Dysphagia, unspecified: Secondary | ICD-10-CM | POA: Diagnosis not present

## 2022-11-12 ENCOUNTER — Other Ambulatory Visit: Payer: Self-pay

## 2022-11-26 ENCOUNTER — Encounter: Payer: Self-pay | Admitting: Cardiology

## 2022-11-26 ENCOUNTER — Ambulatory Visit: Payer: Medicare HMO | Attending: Cardiology | Admitting: Cardiology

## 2022-11-26 VITALS — BP 124/84 | HR 68 | Ht 65.0 in | Wt 245.6 lb

## 2022-11-26 DIAGNOSIS — Z13220 Encounter for screening for lipoid disorders: Secondary | ICD-10-CM | POA: Diagnosis not present

## 2022-11-26 DIAGNOSIS — E559 Vitamin D deficiency, unspecified: Secondary | ICD-10-CM | POA: Diagnosis not present

## 2022-11-26 DIAGNOSIS — I209 Angina pectoris, unspecified: Secondary | ICD-10-CM

## 2022-11-26 DIAGNOSIS — I10 Essential (primary) hypertension: Secondary | ICD-10-CM | POA: Diagnosis not present

## 2022-11-26 DIAGNOSIS — Z131 Encounter for screening for diabetes mellitus: Secondary | ICD-10-CM | POA: Diagnosis not present

## 2022-11-26 DIAGNOSIS — I471 Supraventricular tachycardia, unspecified: Secondary | ICD-10-CM | POA: Diagnosis not present

## 2022-11-26 NOTE — Progress Notes (Signed)
Cardiology Office Note:    Date:  11/26/2022   ID:  Kristy Keller, DOB 09/29/1960, MRN [id number]  PCP:  Kristy James, MD  Cardiologist:  Kristy Brothers, MD   Referring MD: Kristy James, MD    ASSESSMENT:    1. Benign essential hypertension   2. Morbid obesity ([name])    PLAN:    In order of problems listed above:  Primary prevention stressed with the patient.  Importance of compliance with diet medication stressed and patient verbalized standing. Patient was advised to walk at least half an hour a day 5 days a week and she promises to do so. Essential hypertension: Blood pressure is stable and diet was emphasized.  Lifestyle modification urged. Morbid obesity: Weight reduction stressed and diet was emphasized.  Risks of obesity explained. Patient will have complete blood work today.  She gives history of vitamin D deficiency and we will check this and also A1c. Patient will be seen in follow-up appointment in 6 months or earlier if the patient has Kristy concerns.    Medication Adjustments/Labs and Tests Ordered: Current medicines are reviewed at length with the patient today.  Concerns regarding medicines are outlined above.  No orders of the defined types were placed in this encounter.  No orders of the defined types were placed in this encounter.    No chief complaint on file.    History of Present Illness:    Kristy Keller is a 62 y.o. female.  Patient has past medical history of essential hypertension and morbid obesity.  She mentions to me that she has easy fatigability.  No chest pain orthopnea or PND.  At the time of my evaluation, the patient is alert awake oriented and in no distress.   Past Medical History:  Diagnosis Date   Age-related nuclear cataract, bilateral 06/04/2021   Angina pectoris ([name]) 02/28/2022   Anterior basement membrane dystrophy 03/12/2019   Benign essential hypertension 10/06/2019   Last Assessment & Plan:  Formatting of this  note might be different from the original. Asymptomatic, with borderline but controlled blood pressure. Advised to continue monitoring her blood pressure, and to continue with dietary modifications. No pharmacological changes to her management at this visit.   Breast lump 02/21/2018   Cancer of skin, squamous cell 03/12/2019   Formatting of this note might be different from the original. possible right peri orbital   Cat bite of hand, right, sequela 12/21/2020   Cellulitis of right hand 12/08/2020   Formatting of this note might be different from the original. Added automatically from request for surgery [id number]   Chest pain 02/19/2022   Chicken pox    Chronic venous insufficiency 11/07/2017   2019 B Support socks knee high Elevate legs Low salt diet Loose 10-20 lbs Spironolactone   Class 2 severe obesity due to excess calories with serious comorbidity in adult [facility]) 10/06/2019   Classic migraine with aura 03/12/2019   Edema 06/05/2017   Due to gabapentin - resolved off Rx 11/18  Spironolactone   Family history of early CAD 03/05/2017   Baby ASA qd   Family history of heart disease in brother 02/19/2022   Gallbladder pain 02/21/2018   Gallstone 03/05/2017   2014 large 2018 [place]: Cholelithiasis with confluence of gallstones measuring 2.5 cm in length, similar to prior study. No gallbladder wall thickening or pericholecystic fluid   History of hysterectomy 09/04/2022   Hypermetropia of both eyes 03/12/2019   Hypokalemia 02/19/2022   Infected sebaceous cyst  07/19/2017   2/19 back   Insomnia 12/30/2017   2019 refractory Zolpidem prn - not taking Zolpidem  Potential benefits of a long term benzodiazepines  use as well as potential risks  and complications were explained to the patient and were aknowledged.     Intertrigo 09/04/2022   Localized swelling, mass, or lump of lower extremity, bilateral 01/17/2022   Last Assessment & Plan:  Formatting of this note might be different from the  original. The symmetric swelling and erythema, as well has her orthopnea, point towards a cardiac etiology as opposed to deep venous thrombosis. Worsening chronic venous insufficiency is possibly the sole cause of this, and at least a contributory etiology.  CBC, BMP, and pro-BNP ordered. Prescription for 40 mg of Lasix,    Low back pain 09/04/2022   with radiculopathy with radiculopathy   Morbid obesity ([name]) 02/28/2022   Multilevel lumbosacral spondylosis with radiculopathy 04/12/2017   Nodule of left external ear 10/23/2021   Last Assessment & Plan:  Formatting of this note might be different from the original. Benign examination; possibly an area of thickened skin due to friction, dryness, and repeated manipulation by the patient. Advised she avoid alcohol application and use of neosporin. Suggested she try moisturizer to avoid dryness and further irritation.   Nonalcoholic fatty liver disease 02/19/2022   Nuclear sclerotic cataract of both eyes 03/12/2019   OAG (open angle glaucoma) suspect, low risk, bilateral 03/12/2019   Orthopnea 01/17/2022   Last Assessment & Plan:  Formatting of this note might be different from the original. Her cardiac workup in the hospital was reassuring, though her orthopnea and increased swelling in her extremities are concerning. Her lung exam is reassuring, but in consideration of recent events, a plain radiograph of the chest, CBC, BMP, and pro-BNP have been ordered.  Increasing her Lasix to 40 mg twice dail   Pasteurella cellulitis due to cat bite 12/21/2020   Postlaminectomy syndrome of lumbar region 05/19/2021   Formatting of this note might be different from the original. Added automatically from request for surgery [id number]   Preop exam for internal medicine 03/05/2017   [name] is medically clear for her L4 decompression/fusion. Thank you! EKG - nl Labs, CXR - OK Stress test discussed. [name] declined. She was very active prior to her radiculopathy w/o CPs Baby  ASA qd   Presbyopia 03/12/2019   Pseudophakia of left eye 07/15/2022   Pseudophakia of right eye 07/15/2022   Pyogenic arthritis of hand ([name]) 12/21/2020   Formatting of this note might be different from the original. Added automatically from request for surgery [id number]   Radiculopathy of leg 03/06/2017   01/2017 Dr Precious Gilding  LBP - s/p L4 decompr and fusion surgery Apr 10, 2017. LBP and RLE pain is better Flexeril Loose weight Join a gentle yoga class   Regular astigmatism of both eyes 03/12/2019   Rupture of extensor tendon of right hand 12/09/2020   Formatting of this note might be different from the original. S/p repair Dr Tanja Port 12-09-2020   Seborrheic keratosis 09/04/2022   Spinal stenosis of lumbar region with neurogenic claudication 06/07/2021   SVT (supraventricular tachycardia) 11/17/2019   Last Assessment & Plan:  Formatting of this note might be different from the original. Examination suggests she's experiencing supraventricular tachycardia again. Attempted a carotid sinus massage, after explaining the process and obtaining verbal consent, of each side for 10-15 seconds. Failure to convert her back to normal rate despite multiple attempts.   Advised that she'd  have to go to the ED   Unstable angina ([name]) 02/19/2022    Past Surgical History:  Procedure Laterality Date   ABDOMINAL HYSTERECTOMY     partial    Current Medications: Current Meds  Medication Sig   acetaminophen (TYLENOL) 500 MG tablet Take 500 mg by mouth every 5 (five) hours as needed for headache.   aspirin EC 81 MG tablet Take 1 tablet (81 mg total) by mouth daily. Swallow whole.   Cholecalciferol (VITAMIN D3) 2000 units capsule Take 1 capsule (2,000 Units total) by mouth daily.   furosemide (LASIX) 40 MG tablet Take 40 mg by mouth 2 (two) times daily.   gabapentin (NEURONTIN) 300 MG capsule Take 300 mg by mouth at bedtime.   nitroGLYCERIN (NITROSTAT) 0.4 MG SL tablet Place 0.4 mg under the tongue every 5 (five)  minutes as needed for chest pain.   spironolactone (ALDACTONE) 25 MG tablet Take 1 tablet (25 mg total) by mouth daily.   zolpidem (AMBIEN) 5 MG tablet Take 1 tablet (5 mg total) by mouth at bedtime as needed for sleep.   [DISCONTINUED] metoprolol tartrate (LOPRESSOR) 50 MG tablet Take 25 mg by mouth 2 (two) times daily.     Allergies:   Ketorolac, Gabapentin, and Lyrica [pregabalin]   Social History   Socioeconomic History   Marital status: Married    Spouse name: Not on file   Number of children: Not on file   Years of education: Not on file   Highest education level: Not on file  Occupational History   Not on file  Tobacco Use   Smoking status: Former   Smokeless tobacco: Never  Substance and Sexual Activity   Alcohol use: No   Drug use: No   Sexual activity: Never    Partners: Male  Other Topics Concern   Not on file  Social History Narrative   Not on file   Social Determinants of Health   Financial Resource Strain: Not on file  Food Insecurity: Not on file  Transportation Needs: Not on file  Physical Activity: Not on file  Stress: Not on file  Social Connections: Not on file     Family History: The patient's family history includes Cancer (age of onset: 38) in her father; Diabetes in her sister; Heart disease (age of onset: 73) in her brother; Heart disease (age of onset: 43) in her mother.  ROS:   Please see the history of present illness.    All other systems reviewed and are negative.  EKGs/Labs/Other Studies Reviewed:    The following studies were reviewed today: I discussed coronary angiography report with her at length.  CT coronary angiography revealed calcium score of 0 and the test was unremarkable.   Recent Labs: 02/28/2022: BUN 22; Creatinine, Ser 0.97; Potassium 4.0; Sodium 140  Recent Lipid Panel    Component Value Date/Time   CHOL 133 03/07/2017 1333   TRIG 73.0 03/07/2017 1333   HDL 47.70 03/07/2017 1333   CHOLHDL 3 03/07/2017 1333    VLDL 14.6 03/07/2017 1333   LDLCALC 71 03/07/2017 1333    Physical Exam:    VS:  BP 124/84   Pulse 68   Ht 5\' 5"  (1.651 m)   Wt 245 lb 9.6 oz (111.4 kg)   SpO2 96%   BMI 40.87 kg/m     Wt Readings from Last 3 Encounters:  11/26/22 245 lb 9.6 oz (111.4 kg)  02/28/22 236 lb 9.6 oz (107.3 kg)  05/14/18 223 lb (101.2  kg)     GEN: Patient is in no acute distress HEENT: Normal NECK: No JVD; No carotid bruits LYMPHATICS: No lymphadenopathy CARDIAC: Hear sounds regular, 2/6 systolic murmur at the apex. RESPIRATORY:  Clear to auscultation without rales, wheezing or rhonchi  ABDOMEN: Soft, non-tender, non-distended MUSCULOSKELETAL:  No edema; No deformity  SKIN: Warm and dry NEUROLOGIC:  Alert and oriented x 3 PSYCHIATRIC:  Normal affect   Signed, Kristy Brothers, MD  11/26/2022 8:21 AM    [facility]

## 2022-11-26 NOTE — Patient Instructions (Addendum)
Medication Instructions:  Your physician recommends that you continue on your current medications as directed. Please refer to the Current Medication list given to you today.  *If you need a refill on your cardiac medications before your next appointment, please call your pharmacy*   Lab Work: Your physician recommends that you have labs done in the office today. Your test included CMP, CBC, TSH, vitamin D, A1C and lipids.  If you have labs (blood work) drawn today and your tests are completely normal, you will receive your results only by: MyChart Message (if you have MyChart) OR A paper copy in the mail If you have any lab test that is abnormal or we need to change your treatment, we will call you to review the results.   Testing/Procedures: None ordered   Follow-Up: At [facility], you and your health needs are our priority.  As part of our continuing mission to provide you with exceptional heart care, we have created designated Provider Care Teams.  These Care Teams include your primary Cardiologist (physician) and Advanced Practice Providers (APPs -  Physician Assistants and Nurse Practitioners) who all work together to provide you with the care you need, when you need it.  We recommend signing up for the patient portal called "MyChart".  Sign up information is provided on this After Visit Summary.  MyChart is used to connect with patients for Virtual Visits (Telemedicine).  Patients are able to view lab/test results, encounter notes, upcoming appointments, etc.  Non-urgent messages can be sent to your provider as well.   To learn more about what you can do with MyChart, go to [email or web address].    Your next appointment:   6 month(s)  The format for your next appointment:   In Person  Provider:   Belva Crome, MD    Other Instructions none  Important Information About Sugar

## 2022-11-27 LAB — COMPREHENSIVE METABOLIC PANEL
ALT: 50 IU/L — ABNORMAL HIGH (ref 0–32)
AST: 71 IU/L — ABNORMAL HIGH (ref 0–40)
Albumin: 4.3 g/dL (ref 3.9–4.9)
Alkaline Phosphatase: 127 IU/L — ABNORMAL HIGH (ref 44–121)
BUN/Creatinine Ratio: 13 (ref 12–28)
BUN: 13 mg/dL (ref 8–27)
Bilirubin Total: 0.7 mg/dL (ref 0.0–1.2)
CO2: 27 mmol/L (ref 20–29)
Calcium: 9.7 mg/dL (ref 8.7–10.3)
Chloride: 100 mmol/L (ref 96–106)
Creatinine, Ser: 1.01 mg/dL — ABNORMAL HIGH (ref 0.57–1.00)
Globulin, Total: 1.9 g/dL (ref 1.5–4.5)
Glucose: 148 mg/dL — ABNORMAL HIGH (ref 70–99)
Potassium: 4.1 mmol/L (ref 3.5–5.2)
Sodium: 139 mmol/L (ref 134–144)
Total Protein: 6.2 g/dL (ref 6.0–8.5)
eGFR: 63 mL/min/{1.73_m2} (ref >59)

## 2022-11-27 LAB — CBC
Hematocrit: 42.6 % (ref 34.0–46.6)
Hemoglobin: 14 g/dL (ref 11.1–15.9)
MCH: 29.8 pg (ref 26.6–33.0)
MCHC: 32.9 g/dL (ref 31.5–35.7)
MCV: 91 fL (ref 79–97)
Platelets: 224 10*3/uL (ref 150–450)
RBC: 4.7 x10E6/uL (ref 3.77–5.28)
RDW: 12.3 % (ref 11.7–15.4)
WBC: 4.3 10*3/uL (ref 3.4–10.8)

## 2022-11-27 LAB — LIPID PANEL
Chol/HDL Ratio: 3.9 ratio (ref 0.0–4.4)
Cholesterol, Total: 152 mg/dL (ref 100–199)
HDL: 39 mg/dL — ABNORMAL LOW (ref >39)
LDL Chol Calc (NIH): 84 mg/dL (ref 0–99)
Triglycerides: 167 mg/dL — ABNORMAL HIGH (ref 0–149)
VLDL Cholesterol Cal: 29 mg/dL (ref 5–40)

## 2022-11-27 LAB — VITAMIN D 25 HYDROXY (VIT D DEFICIENCY, FRACTURES): Vit D, 25-Hydroxy: 50.5 ng/mL (ref 30.0–100.0)

## 2022-11-27 LAB — HEMOGLOBIN A1C
Est. average glucose Bld gHb Est-mCnc: 148 mg/dL
Hgb A1c MFr Bld: 6.8 % — ABNORMAL HIGH (ref 4.8–5.6)

## 2022-11-27 LAB — TSH: TSH: 0.827 u[IU]/mL (ref 0.450–4.500)

## 2022-12-03 ENCOUNTER — Encounter: Payer: Self-pay | Admitting: Internal Medicine

## 2022-12-03 ENCOUNTER — Ambulatory Visit: Payer: Medicare HMO | Admitting: Internal Medicine

## 2022-12-03 VITALS — BP 120/70 | HR 85 | Temp 97.8°F | Ht 65.0 in | Wt 245.0 lb

## 2022-12-03 DIAGNOSIS — M5441 Lumbago with sciatica, right side: Secondary | ICD-10-CM

## 2022-12-03 DIAGNOSIS — K760 Fatty (change of) liver, not elsewhere classified: Secondary | ICD-10-CM

## 2022-12-03 DIAGNOSIS — R609 Edema, unspecified: Secondary | ICD-10-CM | POA: Diagnosis not present

## 2022-12-03 DIAGNOSIS — G8929 Other chronic pain: Secondary | ICD-10-CM | POA: Diagnosis not present

## 2022-12-03 DIAGNOSIS — M5442 Lumbago with sciatica, left side: Secondary | ICD-10-CM | POA: Diagnosis not present

## 2022-12-03 DIAGNOSIS — E118 Type 2 diabetes mellitus with unspecified complications: Secondary | ICD-10-CM | POA: Insufficient documentation

## 2022-12-03 DIAGNOSIS — Z6839 Body mass index (BMI) 39.0-39.9, adult: Secondary | ICD-10-CM

## 2022-12-03 DIAGNOSIS — Z7985 Long-term (current) use of injectable non-insulin antidiabetic drugs: Secondary | ICD-10-CM | POA: Diagnosis not present

## 2022-12-03 DIAGNOSIS — R635 Abnormal weight gain: Secondary | ICD-10-CM

## 2022-12-03 DIAGNOSIS — E66812 Obesity, class 2: Secondary | ICD-10-CM

## 2022-12-03 HISTORY — DX: Abnormal weight gain: R63.5

## 2022-12-03 HISTORY — DX: Type 2 diabetes mellitus with unspecified complications: E11.8

## 2022-12-03 MED ORDER — OZEMPIC (0.25 OR 0.5 MG/DOSE) 2 MG/3ML ~~LOC~~ SOPN: PEN_INJECTOR | SUBCUTANEOUS | 1 refills | Status: DC

## 2022-12-03 MED ORDER — METOPROLOL SUCCINATE ER 50 MG PO TB24: 25.0000 mg | ORAL_TABLET | Freq: Two times a day (BID) | ORAL | 3 refills | Status: DC

## 2022-12-03 MED ORDER — METOPROLOL SUCCINATE ER 25 MG PO TB24: 25.0000 mg | ORAL_TABLET | Freq: Every day | ORAL | 3 refills | Status: DC

## 2022-12-03 NOTE — Assessment & Plan Note (Signed)
Discussed diet, fasting 

## 2022-12-03 NOTE — Assessment & Plan Note (Signed)
New onset Start Ozempic

## 2022-12-03 NOTE — Assessment & Plan Note (Addendum)
Cont w/ Lasix, Spironolactone Due to gabapentin - resolved off Rx 11/18; relapsed 2024

## 2022-12-03 NOTE — Assessment & Plan Note (Signed)
Start Ozempic for DM

## 2022-12-03 NOTE — Progress Notes (Signed)
Subjective:  Patient ID: Kristy Keller, female    DOB: Mar 27, 1961  Age: 62 y.o. MRN: [id number]  CC: New Patient (Initial Visit)   HPI Kristy Keller presents for a new pt - to re-establish [name] had SVT w/HR 200 6 months ago C/o wt gain 80 lbs in 1 year, fatigue C/o new onset DM    Outpatient Medications Prior to Visit  Medication Sig Dispense Refill   acetaminophen (TYLENOL) 500 MG tablet Take 500 mg by mouth every 5 (five) hours as needed for headache.     aspirin EC 81 MG tablet Take 1 tablet (81 mg total) by mouth daily. Swallow whole. 90 tablet 3   Cholecalciferol (VITAMIN D3) 2000 units capsule Take 1 capsule (2,000 Units total) by mouth daily. 100 capsule 3   furosemide (LASIX) 40 MG tablet Take 40 mg by mouth 2 (two) times daily.     gabapentin (NEURONTIN) 300 MG capsule Take 300 mg by mouth at bedtime.     nitroGLYCERIN (NITROSTAT) 0.4 MG SL tablet Place 0.4 mg under the tongue every 5 (five) minutes as needed for chest pain.     zinc sulfate 220 (50 Zn) MG capsule Take 220 mg by mouth daily.     spironolactone (ALDACTONE) 25 MG tablet Take 1 tablet (25 mg total) by mouth daily. 90 tablet 3   zolpidem (AMBIEN) 5 MG tablet Take 1 tablet (5 mg total) by mouth at bedtime as needed for sleep. 30 tablet 2   No facility-administered medications prior to visit.    ROS: Review of Systems  Constitutional:  Positive for fatigue. Negative for activity change, appetite change, chills and unexpected weight change.  HENT:  Negative for congestion, mouth sores and sinus pressure.   Eyes:  Negative for visual disturbance.  Respiratory:  Negative for cough and chest tightness.   Cardiovascular:  Positive for palpitations and leg swelling. Negative for chest pain.  Gastrointestinal:  Negative for abdominal pain and nausea.  Genitourinary:  Negative for difficulty urinating, frequency and vaginal pain.  Musculoskeletal:  Positive for arthralgias, back pain and gait problem.   Skin:  Positive for color change. Negative for pallor and rash.  Neurological:  Negative for dizziness, tremors, weakness, numbness and headaches.  Psychiatric/Behavioral:  Negative for confusion, sleep disturbance and suicidal ideas.     Objective:  BP 120/70 (BP Location: Right Arm, Patient Position: Sitting, Cuff Size: Large)   Pulse 85   Temp 97.8 F (36.6 C) (Oral)   Ht 5\' 5"  (1.651 m)   Wt 245 lb (111.1 kg)   SpO2 97%   BMI 40.77 kg/m   BP Readings from Last 3 Encounters:  12/03/22 120/70  11/26/22 124/84  03/16/22 (!) 108/55    Wt Readings from Last 3 Encounters:  12/03/22 245 lb (111.1 kg)  11/26/22 245 lb 9.6 oz (111.4 kg)  02/28/22 236 lb 9.6 oz (107.3 kg)    Physical Exam Constitutional:      General: She is not in acute distress.    Appearance: She is well-developed. She is obese.  HENT:     Head: Normocephalic.     Right Ear: External ear normal.     Left Ear: External ear normal.     Nose: Nose normal.  Eyes:     General:        Right eye: No discharge.        Left eye: No discharge.     Conjunctiva/sclera: Conjunctivae normal.     Pupils:  Pupils are equal, round, and reactive to light.  Neck:     Thyroid: No thyromegaly.     Vascular: No JVD.     Trachea: No tracheal deviation.  Cardiovascular:     Rate and Rhythm: Normal rate and regular rhythm.     Heart sounds: Normal heart sounds.  Pulmonary:     Effort: No respiratory distress.     Breath sounds: No stridor. No wheezing.  Abdominal:     General: Bowel sounds are normal. There is no distension.     Palpations: Abdomen is soft. There is no mass.     Tenderness: There is no abdominal tenderness. There is no guarding or rebound.  Musculoskeletal:        General: No tenderness.     Cervical back: Normal range of motion and neck supple. No rigidity.     Right lower leg: Edema present.     Left lower leg: Edema present.  Lymphadenopathy:     Cervical: No cervical adenopathy.  Skin:     Findings: Erythema present. No rash.  Neurological:     Cranial Nerves: No cranial nerve deficit.     Motor: No abnormal muscle tone.     Coordination: Coordination normal.     Deep Tendon Reflexes: Reflexes normal.  Psychiatric:        Behavior: Behavior normal.        Thought Content: Thought content normal.        Judgment: Judgment normal.   1+ B edema Shins w/erythema     A total time of 60 minutes was spent preparing to see the patient, reviewing tests, x-rays, operative reports and other medical records.  Also, obtaining history and performing comprehensive physical exam.  Additionally, counseling the patient regarding the above listed issues - DM, edema, PSVT.   Finally, documenting clinical information in the health records, coordination of care, educating the patient. It is a complex case.  Lab Results  Component Value Date   WBC 4.3 11/26/2022   HGB 14.0 11/26/2022   HCT 42.6 11/26/2022   PLT 224 11/26/2022   GLUCOSE 148 (H) 11/26/2022   CHOL 152 11/26/2022   TRIG 167 (H) 11/26/2022   HDL 39 (L) 11/26/2022   LDLCALC 84 11/26/2022   ALT 50 (H) 11/26/2022   AST 71 (H) 11/26/2022   NA 139 11/26/2022   K 4.1 11/26/2022   CL 100 11/26/2022   CREATININE 1.01 (H) 11/26/2022   BUN 13 11/26/2022   CO2 27 11/26/2022   TSH 0.827 11/26/2022   INR 1.1 (H) 03/07/2017   HGBA1C 6.8 (H) 11/26/2022    CT CORONARY MORPH W/CTA COR W/SCORE W/CA W/CM &/OR WO/CM  Addendum Date: 03/16/2022   ADDENDUM REPORT: 03/16/2022 10:49 EXAM: OVER-READ INTERPRETATION  PET-CT CHEST The following report is an over-read performed by radiologist Dr. Leatha Gilding [facility] on 03/16/2022. This over-read does not include interpretation of cardiac or coronary anatomy or pathology. The cardiac CT interpretation by the cardiologist is to be attached. COMPARISON:  None. FINDINGS: No evidence for lymphadenopathy within the visualized mediastinum or hilar regions. The visualized lung  parenchyma shows no suspicious pulmonary nodule or mass. No focal airspace consolidation. No effusion. Visualized portions of the upper abdomen are unremarkable. No suspicious lytic or sclerotic osseous abnormality. IMPRESSION: No acute or clinically significant extracardiac findings. Electronically Signed   By: Kennith Center M.D.   On: 03/16/2022 10:49   Result Date: 03/16/2022 HISTORY: Chest pain, nonspecific EXAM: Cardiac/Coronary  CT  TECHNIQUE: The patient was scanned on a [employer]. PROTOCOL: A 120 kV prospective scan was triggered in the descending thoracic aorta at 111 HU's. Axial non-contrast 3 mm slices were carried out through the heart. The data set was analyzed on a dedicated work station and scored using the Agatston method. Gantry rotation speed was 250 msecs and collimation was .6 mm. Beta blockade and 0.8 mg of sl NTG was given. The 3D data set was reconstructed in 5% intervals of the 35-75 % of the R-R cycle. Systolic and diastolic phases were analyzed on a dedicated work station using MPR, MIP and VRT modes. The patient received contrast: OMNIPAQUE IOHEXOL 350 MG/ML SOLN. FINDINGS: Image quality: Good Noise artifact is: Mild motion artifact and reduced SNR. Coronary calcium score is 0. Coronary arteries: Normal coronary origins.  Co-dominance. Right Coronary Artery: No detectable plaque or stenosis. Left Main Coronary Artery: No detectable plaque or stenosis. Left Anterior Descending Coronary Artery: No detectable plaque or stenosis. Left Circumflex Artery: No detectable plaque or stenosis. Motion artifact is present in the area of OM2 noted by Roadmap analysis. Aorta: Normal size, 30 mm at the mid ascending aorta (level of the PA bifurcation) measured double oblique. Trivial calcifications. No dissection. Aortic Valve: No calcifications.  Tricuspid aortic valve. Other findings: Normal pulmonary vein drainage into the left atrium. Normal left atrial appendage without thrombus.  Normal size of the pulmonary artery. Please see separate report from [facility] for non-cardiac findings. IMPRESSION: 1. No evidence of CAD, 0% stenosis, CADRADS 0. 2. Coronary calcium score of 0. 3. Normal coronary origins with co-dominance. RECOMMENDATIONS: CAD-RADS 0. No evidence of CAD (0%). Consider non-atherosclerotic causes of chest pain. Electronically Signed: By: Weston Brass M.D. On: 03/16/2022 10:22    Assessment & Plan:   Problem List Items Addressed This Visit     Edema    Cont w/ Lasix, Spironolactone Due to gabapentin - resolved off Rx 11/18; relapsed 2024      Class 2 severe obesity due to excess calories with serious comorbidity in adult [facility])    Discussed diet, fasting      Relevant Medications   Semaglutide,0.25 or 0.5MG /DOS, (OZEMPIC, 0.25 OR 0.5 MG/DOSE,) 2 MG/3ML SOPN   Nonalcoholic fatty liver disease    Start Ozempic for DM      Low back pain    S/p surgery x2 - with radiculopathy with radiculopathy Dr Precious Gilding      Weight gain    Discussed diet, fasting      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 Take with or immediately following a meal.    Dispense:  30 tablet    Refill:  3   Semaglutide,0.25 or 0.5MG /DOS, (OZEMPIC, 0.25 OR 0.5 MG/DOSE,) 2 MG/3ML SOPN    Sig: Use 0.25 mg weekly sq for 1 month, then 0.5 mg sq weekly    Dispense:  9 mL    Refill:  1   metoprolol succinate (TOPROL-XL) 25 MG 24 hr tablet    Sig: Take 1 tablet (25 mg total) by mouth at bedtime.    Dispense:  90  tablet    Refill:  3      Follow-up: Return in about 2 months (around 02/03/2023) for a follow-up visit.  Sonda Primes,  MD

## 2022-12-03 NOTE — Assessment & Plan Note (Signed)
S/p surgery x2 - with radiculopathy with radiculopathy Dr Precious Gilding

## 2022-12-29 DIAGNOSIS — R1031 Right lower quadrant pain: Secondary | ICD-10-CM | POA: Diagnosis not present

## 2022-12-30 DIAGNOSIS — K5732 Diverticulitis of large intestine without perforation or abscess without bleeding: Secondary | ICD-10-CM | POA: Diagnosis not present

## 2022-12-30 DIAGNOSIS — R1030 Lower abdominal pain, unspecified: Secondary | ICD-10-CM | POA: Diagnosis not present

## 2022-12-30 DIAGNOSIS — K5730 Diverticulosis of large intestine without perforation or abscess without bleeding: Secondary | ICD-10-CM | POA: Diagnosis not present

## 2022-12-30 DIAGNOSIS — K5792 Diverticulitis of intestine, part unspecified, without perforation or abscess without bleeding: Secondary | ICD-10-CM | POA: Diagnosis not present

## 2022-12-30 DIAGNOSIS — N839 Noninflammatory disorder of ovary, fallopian tube and broad ligament, unspecified: Secondary | ICD-10-CM | POA: Diagnosis not present

## 2022-12-30 DIAGNOSIS — R102 Pelvic and perineal pain: Secondary | ICD-10-CM | POA: Diagnosis not present

## 2022-12-30 DIAGNOSIS — Z87891 Personal history of nicotine dependence: Secondary | ICD-10-CM | POA: Diagnosis not present

## 2022-12-30 DIAGNOSIS — K5900 Constipation, unspecified: Secondary | ICD-10-CM | POA: Diagnosis not present

## 2022-12-30 DIAGNOSIS — N83292 Other ovarian cyst, left side: Secondary | ICD-10-CM | POA: Diagnosis not present

## 2023-01-10 ENCOUNTER — Ambulatory Visit: Payer: Medicare HMO | Admitting: Internal Medicine

## 2023-01-17 ENCOUNTER — Encounter (INDEPENDENT_AMBULATORY_CARE_PROVIDER_SITE_OTHER): Payer: Self-pay

## 2023-01-21 NOTE — Progress Notes (Signed)
Kristy Keller    [id number]    March 14, 1961  Primary Care Physician:Plotnikov, Georgina Quint, MD  Referring Physician: Raquel James, MD [address] [place],  [place] [address]   Chief complaint: No chief complaint on file.   HPI: Kristy Keller is a 62 y.o. female presenting to clinic today for consult for abdominal pain and diarrhea. She was seen in the ED on 12-30-22 for abdominal pain along with constipation. She has started ozempic about a month ago.   Today,   GI Hx:  US Abdomen Complete 03-27-17 1. Cholelithiasis with confluence of gallstones measuring 2.5 cm in length, similar to prior study. No gallbladder wall thickening or pericholecystic fluid. 2. Cyst in right lobe of liver. No other focal liver lesions are identified. Increased liver echogenicity most likely is indicative of hepatic steatosis. It should be noted that the sensitivity of ultrasound for detection of noncystic liver lesions is diminished in this circumstance. 3. Portions of pancreas obscured by gas. Visualized portions of pancreas appear normal. 4.  Study otherwise unremarkable.    Current Outpatient Medications:    acetaminophen (TYLENOL) 500 MG tablet, Take 500 mg by mouth every 5 (five) hours as needed for headache., Disp: , Rfl:    aspirin EC 81 MG tablet, Take 1 tablet (81 mg total) by mouth daily. Swallow whole., Disp: 90 tablet, Rfl: 3   Cholecalciferol (VITAMIN D3) 2000 units capsule, Take 1 capsule (2,000 Units total) by mouth daily., Disp: 100 capsule, Rfl: 3   furosemide (LASIX) 40 MG tablet, Take 40 mg by mouth 2 (two) times daily., Disp: , Rfl:    gabapentin (NEURONTIN) 300 MG capsule, Take 300 mg by mouth at bedtime., Disp: , Rfl:    metoprolol succinate (TOPROL-XL) 25 MG 24 hr tablet, Take 1 tablet (25 mg total) by mouth at bedtime., Disp: 90 tablet, Rfl: 3   nitroGLYCERIN (NITROSTAT) 0.4 MG SL tablet, Place 0.4 mg under the tongue every 5 (five)  minutes as needed for chest pain., Disp: , Rfl:    Semaglutide,0.25 or 0.5MG /DOS, (OZEMPIC, 0.25 OR 0.5 MG/DOSE,) 2 MG/3ML SOPN, Use 0.25 mg weekly sq for 1 month, then 0.5 mg sq weekly, Disp: 9 mL, Rfl: 1   zinc sulfate 220 (50 Zn) MG capsule, Take 220 mg by mouth daily., Disp: , Rfl:    Allergies as of 01/23/2023 - Review Complete 12/03/2022  Allergen Reaction Noted   Ketorolac  06/07/2021   Gabapentin Swelling 06/05/2017   Lyrica [pregabalin] Other (See Comments) 12/30/2017    Past Medical History:  Diagnosis Date   Age-related nuclear cataract, bilateral 06/04/2021   Angina pectoris ([name]) 02/28/2022   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 Asymptomatic, with borderline but controlled blood pressure. Advised to continue monitoring her blood pressure, and to continue with dietary modifications. No pharmacological changes to her management at this visit.   Breast lump 02/21/2018   Cancer of skin, squamous cell 03/12/2019   Formatting of this note might be different from the original. possible right peri orbital   Cat bite of hand, right, sequela 12/21/2020   Cellulitis of right hand 12/08/2020   Formatting of this note might be different from the original. Added automatically from request for surgery [id number]   Chest pain 02/19/2022   Chicken pox    Chronic venous  insufficiency 11/07/2017   2019 B Support socks knee high Elevate legs Low salt diet Loose 10-20 lbs Spironolactone   Class 2 severe obesity due to excess calories with serious comorbidity in adult [facility]) 10/06/2019   Classic migraine with aura 03/12/2019   Edema 06/05/2017   Due to gabapentin - resolved off Rx 11/18  Spironolactone   Family history of early CAD 03/05/2017   Baby ASA qd   Family history of heart disease in brother 02/19/2022   Gallbladder pain 02/21/2018   Gallstone  03/05/2017   2014 large 2018 [place]: Cholelithiasis with confluence of gallstones measuring 2.5 cm in length, similar to prior study. No gallbladder wall thickening or pericholecystic fluid   History of hysterectomy 09/04/2022   Hypermetropia of both eyes 03/12/2019   Hypokalemia 02/19/2022   Infected sebaceous cyst 07/19/2017   2/19 back   Insomnia 12/30/2017   2019 refractory Zolpidem prn - not taking Zolpidem  Potential benefits of a long term benzodiazepines  use as well as potential risks  and complications were explained to the patient and were aknowledged.     Intertrigo 09/04/2022   Localized swelling, mass, or lump of lower extremity, bilateral 01/17/2022   Last Assessment & Plan:  Formatting of this note might be different from the original. The symmetric swelling and erythema, as well has her orthopnea, point towards a cardiac etiology as opposed to deep venous thrombosis. Worsening chronic venous insufficiency is possibly the sole cause of this, and at least a contributory etiology.  CBC, BMP, and pro-BNP ordered. Prescription for 40 mg of Lasix,    Low back pain 09/04/2022   with radiculopathy with radiculopathy   Morbid obesity ([name]) 02/28/2022   Multilevel lumbosacral spondylosis with radiculopathy 04/12/2017   Nodule of left external ear 10/23/2021   Last Assessment & Plan:  Formatting of this note might be different from the original. Benign examination; possibly an area of thickened skin due to friction, dryness, and repeated manipulation by the patient. Advised she avoid alcohol application and use of neosporin. Suggested she try moisturizer to avoid dryness and further irritation.   Nonalcoholic fatty liver disease 02/19/2022   Nuclear sclerotic cataract of both eyes 03/12/2019   OAG (open angle glaucoma) suspect, low risk, bilateral 03/12/2019   Orthopnea 01/17/2022   Last Assessment & Plan:  Formatting of this note might be different from the original. Her cardiac workup in  the hospital was reassuring, though her orthopnea and increased swelling in her extremities are concerning. Her lung exam is reassuring, but in consideration of recent events, a plain radiograph of the chest, CBC, BMP, and pro-BNP have been ordered.  Increasing her Lasix to 40 mg twice dail   Pasteurella cellulitis due to cat bite 12/21/2020   Postlaminectomy syndrome of lumbar region 05/19/2021   Formatting of this note might be different from the original. Added automatically from request for surgery [id number]   Preop exam for internal medicine 03/05/2017   [name] is medically clear for her L4 decompression/fusion. Thank you! EKG - nl Labs, CXR - OK Stress test discussed. [name] declined. She was very active prior to her radiculopathy w/o CPs Baby ASA qd   Presbyopia 03/12/2019   Pseudophakia of left eye 07/15/2022   Pseudophakia of right eye 07/15/2022   Pyogenic arthritis of hand ([name]) 12/21/2020   Formatting of this note might be different from the original. Added automatically from request for surgery [id number]   Radiculopathy of leg 03/06/2017   01/2017 Dr Precious Gilding  LBP -  s/p L4 decompr and fusion surgery Apr 10, 2017. LBP and RLE pain is better Flexeril Loose weight Join a gentle yoga class   Regular astigmatism of both eyes 03/12/2019   Rupture of extensor tendon of right hand 12/09/2020   Formatting of this note might be different from the original. S/p repair Dr Tanja Port 12-09-2020   Seborrheic keratosis 09/04/2022   Spinal stenosis of lumbar region with neurogenic claudication 06/07/2021   SVT (supraventricular tachycardia) 11/17/2019   Last Assessment & Plan:  Formatting of this note might be different from the original. Examination suggests she's experiencing supraventricular tachycardia again. Attempted a carotid sinus massage, after explaining the process and obtaining verbal consent, of each side for 10-15 seconds. Failure to convert her back to normal rate despite multiple attempts.    Advised that she'd have to go to the ED   Unstable angina ([name]) 02/19/2022    Past Surgical History:  Procedure Laterality Date   ABDOMINAL HYSTERECTOMY     partial    Family History  Problem Relation Age of Onset   Heart disease Mother 47       CHF, CAD   Cancer Father 47       lung ca   Diabetes Sister    Heart disease Brother 90       CAD, CABG    Social History   Socioeconomic History   Marital status: Married    Spouse name: Not on file   Number of children: Not on file   Years of education: Not on file   Highest education level: Not on file  Occupational History   Not on file  Tobacco Use   Smoking status: Former   Smokeless tobacco: Never  Substance and Sexual Activity   Alcohol use: No   Drug use: No   Sexual activity: Never    Partners: Male  Other Topics Concern   Not on file  Social History Narrative   Not on file   Social Determinants of Health   Financial Resource Strain: Low Risk  (01/07/2023)   Overall Financial Resource Strain (CARDIA)    Difficulty of Paying Living Expenses: Not hard at all  Food Insecurity: No Food Insecurity (01/07/2023)   Hunger Vital Sign    Worried About Running Out of Food in the Last Year: Never true    Ran Out of Food in the Last Year: Never true  Transportation Needs: No Transportation Needs (01/07/2023)   PRAPARE - Administrator, Civil Service (Medical): No    Lack of Transportation (Non-Medical): No  Physical Activity: Unknown (01/07/2023)   Exercise Vital Sign    Days of Exercise per Week: 0 days    Minutes of Exercise per Session: Not on file  Stress: No Stress Concern Present (01/07/2023)   [employer] of Occupational Health - Occupational Stress Questionnaire    Feeling of Stress : Not at all  Social Connections: Unknown (01/07/2023)   Social Connection and Isolation Panel [NHANES]    Frequency of Communication with Friends and Family: Once a week    Frequency of Social Gatherings with Friends  and Family: Patient declined    Attends Religious Services: Patient declined    Database administrator or Organizations: No    Attends Engineer, structural: Not on file    Marital Status: Married  Catering manager Violence: Not on file      Review of systems: Review of Systems    Physical Exam: There were no  vitals filed for this visit. There is no height or weight on file to calculate BMI.  General: well-appearing ***  Eyes: sclera anicteric, no redness ENT: oral mucosa moist without lesions, no cervical or supraclavicular lymphadenopathy CV: RRR, no JVD, no peripheral edema Resp: clear to auscultation bilaterally, normal RR and effort noted GI: soft, no tenderness, with active bowel sounds. No guarding or palpable organomegaly noted. Skin; warm and dry, no rash or jaundice noted Neuro: awake, alert and oriented x 3. Normal gross motor function and fluent speech   Data Reviewed:  Reviewed labs, radiology imaging, old records and pertinent past GI work up   Assessment and Plan/Recommendations:  ***  This visit required *** minutes of patient care (this includes precharting, chart review, review of results, face-to-face time used for counseling as well as treatment plan and follow-up. The patient was provided an opportunity to ask questions and all were answered. The patient agreed with the plan and demonstrated an understanding of the instructions.  Iona Beard , MD  CC: Raquel James, MD   Ladona Mow [employer] as a scribe for Marsa Aris, MD.,have documented all relevant documentation on the behalf of Marsa Aris, MD,as directed by  Marsa Aris, MD while in the presence of Marsa Aris, MD.   I, Marsa Aris, MD, have reviewed all documentation for this visit. The documentation on 01/21/23 for the exam, diagnosis, procedures, and orders are all accurate and complete.

## 2023-01-23 ENCOUNTER — Ambulatory Visit: Payer: Medicare HMO | Admitting: Gastroenterology

## 2023-01-23 ENCOUNTER — Encounter: Payer: Self-pay | Admitting: Gastroenterology

## 2023-01-23 VITALS — BP 118/70 | HR 86 | Ht 65.0 in | Wt 234.0 lb

## 2023-01-23 DIAGNOSIS — Z8719 Personal history of other diseases of the digestive system: Secondary | ICD-10-CM

## 2023-01-23 DIAGNOSIS — K582 Mixed irritable bowel syndrome: Secondary | ICD-10-CM | POA: Diagnosis not present

## 2023-01-23 DIAGNOSIS — Z1211 Encounter for screening for malignant neoplasm of colon: Secondary | ICD-10-CM | POA: Diagnosis not present

## 2023-01-23 MED ORDER — NA SULFATE-K SULFATE-MG SULF 17.5-3.13-1.6 GM/177ML PO SOLN: 1.0000 | Freq: Once | ORAL | 0 refills | Status: AC

## 2023-01-23 NOTE — Patient Instructions (Addendum)
Use stool softener daily  Take Benefiber 1 teaspoon three times a day  Use magnesium citrate every other day  Low residue diet     Foods Allowed on a Low Residue Diet  refined grain products like white breads, cereals, and pastas (look for less than 2g of fibre per serving on label) white rice juices without pulp or seeds meats, fish, and eggs oil, margarine, butter, mayonnaise, and salad dressings fruit without peels or seeds and certain canned or well-cooked fruit (e.g., peeled apples, seedless peeled grapes, banana, cantaloupe, etc.) some soft, cooked vegetables (e.g., beets, beans, carrots, cucumber, eggplant, mushrooms, etc.) limit of 2 cups/day: milk, yogurt, puddings, cream based soups   Foods to Avoid on a Low Residue Diet  whole grain breads, cereals, and pastas (e.g., oatmeal, millet, buckwheat, flax, popcorn) raw vegetables the following vegetables, whether cooked or raw: broccoli, cauliflower, Brussels sprouts, cabbage, kale, Swiss chard dried fruit, berries, and other fruit with skin or seeds tough meats with gristle crunchy peanut butter (smooth is okay) seeds and nuts dried beans, peas, and lentils  You have been scheduled for a colonoscopy. Please follow written instructions given to you at your visit today.   Please pick up your prep supplies at the pharmacy within the next 1-3 days.  If you use inhalers (even only as needed), please bring them with you on the day of your procedure.  DO NOT TAKE 7 DAYS PRIOR TO TEST- Trulicity (dulaglutide) Ozempic, Wegovy (semaglutide) Mounjaro (tirzepatide) Bydureon Bcise (exanatide extended release)  DO NOT TAKE 1 DAY PRIOR TO YOUR TEST Rybelsus (semaglutide) Adlyxin (lixisenatide) Victoza (liraglutide) Byetta (exanatide) ___________________________________________________________________________   Due to recent changes in healthcare laws, you may see the results of your imaging and laboratory studies on MyChart  before your provider has had a chance to review them.  We understand that in some cases there may be results that are confusing or concerning to you. Not all laboratory results come back in the same time frame and the provider may be waiting for multiple results in order to interpret others.  Please give [place] 48 hours in order for your provider to thoroughly review all the results before contacting the office for clarification of your results.    _______________________________________________________  If your blood pressure at your visit was 140/90 or greater, please contact your primary care physician to follow up on this.  _______________________________________________________  If you are age 63 or older, your body mass index should be between 23-30. Your Body mass index is 38.94 kg/m. If this is out of the aforementioned range listed, please consider follow up with your Primary Care Provider.  If you are age 63 or younger, your body mass index should be between 19-25. Your Body mass index is 38.94 kg/m. If this is out of the aformentioned range listed, please consider follow up with your Primary Care Provider.   ________________________________________________________  The [facility] providers would like to encourage you to use [facility] to communicate with providers for non-urgent requests or questions.  Due to long hold times on the telephone, sending your provider a message by [facility] may be a faster and more efficient way to get a response.  Please allow 48 business hours for a response.  Please remember that this is for non-urgent requests.  _______________________________________________________   I appreciate the  opportunity to care for you  Thank You   Marsa Aris , MD

## 2023-02-05 ENCOUNTER — Ambulatory Visit: Payer: Medicare HMO | Admitting: Internal Medicine

## 2023-02-07 ENCOUNTER — Ambulatory Visit: Payer: Medicare HMO | Admitting: Internal Medicine

## 2023-02-18 ENCOUNTER — Ambulatory Visit: Payer: Medicare HMO

## 2023-02-21 ENCOUNTER — Ambulatory Visit (INDEPENDENT_AMBULATORY_CARE_PROVIDER_SITE_OTHER): Payer: Medicare HMO

## 2023-02-21 ENCOUNTER — Encounter: Payer: Self-pay | Admitting: Internal Medicine

## 2023-02-21 ENCOUNTER — Ambulatory Visit (INDEPENDENT_AMBULATORY_CARE_PROVIDER_SITE_OTHER): Payer: Medicare HMO | Admitting: Internal Medicine

## 2023-02-21 VITALS — BP 118/73 | HR 73 | Temp 98.0°F | Ht 65.0 in | Wt 233.0 lb

## 2023-02-21 VITALS — BP 138/72 | HR 78 | Ht 65.0 in | Wt 233.8 lb

## 2023-02-21 DIAGNOSIS — M5442 Lumbago with sciatica, left side: Secondary | ICD-10-CM | POA: Diagnosis not present

## 2023-02-21 DIAGNOSIS — Z0000 Encounter for general adult medical examination without abnormal findings: Secondary | ICD-10-CM

## 2023-02-21 DIAGNOSIS — E118 Type 2 diabetes mellitus with unspecified complications: Secondary | ICD-10-CM

## 2023-02-21 DIAGNOSIS — Z1231 Encounter for screening mammogram for malignant neoplasm of breast: Secondary | ICD-10-CM | POA: Diagnosis not present

## 2023-02-21 DIAGNOSIS — G8929 Other chronic pain: Secondary | ICD-10-CM

## 2023-02-21 DIAGNOSIS — Z6839 Body mass index (BMI) 39.0-39.9, adult: Secondary | ICD-10-CM

## 2023-02-21 DIAGNOSIS — Z7985 Long-term (current) use of injectable non-insulin antidiabetic drugs: Secondary | ICD-10-CM

## 2023-02-21 DIAGNOSIS — R609 Edema, unspecified: Secondary | ICD-10-CM

## 2023-02-21 DIAGNOSIS — M5441 Lumbago with sciatica, right side: Secondary | ICD-10-CM | POA: Diagnosis not present

## 2023-02-21 MED ORDER — SEMAGLUTIDE (1 MG/DOSE) 4 MG/3ML ~~LOC~~ SOPN: 1.0000 mg | PEN_INJECTOR | SUBCUTANEOUS | 3 refills | Status: DC

## 2023-02-21 NOTE — Progress Notes (Signed)
Subjective:  Patient ID: Kristy Keller, female    DOB: [birth year]  Age: 62 y.o. MRN: [id number]  CC: Follow-up (2 MNTH F/U, DISCUSS ozempic, diverticulitis)   HPI Kristy Keller presents for DM, obesity, LBP  Outpatient Medications Prior to Visit  Medication Sig Dispense Refill   acetaminophen (TYLENOL) 500 MG tablet Take 500 mg by mouth every 5 (five) hours as needed for headache.     aspirin EC 81 MG tablet Take 1 tablet (81 mg total) by mouth daily. Swallow whole. 90 tablet 3   Cholecalciferol (VITAMIN D3) 2000 units capsule Take 1 capsule (2,000 Units total) by mouth daily. 100 capsule 3   furosemide (LASIX) 40 MG tablet Take 40 mg by mouth 2 (two) times daily.     gabapentin (NEURONTIN) 300 MG capsule Take 300 mg by mouth at bedtime.     metoprolol succinate (TOPROL-XL) 25 MG 24 hr tablet Take 1 tablet (25 mg total) by mouth at bedtime. 90 tablet 3   nitroGLYCERIN (NITROSTAT) 0.4 MG SL tablet Place 0.4 mg under the tongue every 5 (five) minutes as needed for chest pain.     zinc sulfate 220 (50 Zn) MG capsule Take 220 mg by mouth daily.     Semaglutide,0.25 or 0.5MG /DOS, (OZEMPIC, 0.25 OR 0.5 MG/DOSE,) 2 MG/3ML SOPN Use 0.25 mg weekly sq for 1 month, then 0.5 mg sq weekly 9 mL 1   No facility-administered medications prior to visit.    ROS: Review of Systems  Constitutional:  Negative for activity change, appetite change, chills, fatigue and unexpected weight change.  HENT:  Negative for congestion, mouth sores and sinus pressure.   Eyes:  Negative for visual disturbance.  Respiratory:  Negative for cough and chest tightness.   Gastrointestinal:  Negative for abdominal pain and nausea.  Genitourinary:  Negative for difficulty urinating, frequency and vaginal pain.  Musculoskeletal:  Positive for arthralgias. Negative for back pain and gait problem.  Skin:  Negative for pallor and rash.  Neurological:  Negative for dizziness, tremors, weakness, numbness and  headaches.  Psychiatric/Behavioral:  Negative for confusion and sleep disturbance.     Objective:  BP 118/73 (BP Location: Left Arm, Patient Position: Sitting, Cuff Size: Large)   Pulse 73   Temp 98 F (36.7 C) (Oral)   Ht 5\' 5"  (1.651 m)   Wt 233 lb (105.7 kg)   SpO2 95%   BMI 38.77 kg/m   BP Readings from Last 3 Encounters:  02/21/23 118/73  02/21/23 138/72  01/23/23 118/70    Wt Readings from Last 3 Encounters:  02/21/23 233 lb (105.7 kg)  02/21/23 233 lb 12.8 oz (106.1 kg)  01/23/23 234 lb (106.1 kg)    Physical Exam Constitutional:      General: She is not in acute distress.    Appearance: She is well-developed. She is obese.  HENT:     Head: Normocephalic.     Right Ear: External ear normal.     Left Ear: External ear normal.     Nose: Nose normal.  Eyes:     General:        Right eye: No discharge.        Left eye: No discharge.     Conjunctiva/sclera: Conjunctivae normal.     Pupils: Pupils are equal, round, and reactive to light.  Neck:     Thyroid: No thyromegaly.     Vascular: No JVD.     Trachea: No tracheal deviation.  Cardiovascular:  Rate and Rhythm: Normal rate and regular rhythm.     Heart sounds: Normal heart sounds.  Pulmonary:     Effort: No respiratory distress.     Breath sounds: No stridor. No wheezing.  Abdominal:     General: Bowel sounds are normal. There is no distension.     Palpations: Abdomen is soft. There is no mass.     Tenderness: There is no abdominal tenderness. There is no guarding or rebound.  Musculoskeletal:        General: No tenderness.     Cervical back: Normal range of motion and neck supple. No rigidity.  Lymphadenopathy:     Cervical: No cervical adenopathy.  Skin:    Findings: No erythema or rash.  Neurological:     Cranial Nerves: No cranial nerve deficit.     Motor: No abnormal muscle tone.     Coordination: Coordination normal.     Deep Tendon Reflexes: Reflexes normal.  Psychiatric:         Behavior: Behavior normal.        Thought Content: Thought content normal.        Judgment: Judgment normal.   B knees w/pain  Lab Results  Component Value Date   WBC 4.3 11/26/2022   HGB 14.0 11/26/2022   HCT 42.6 11/26/2022   PLT 224 11/26/2022   GLUCOSE 148 (H) 11/26/2022   CHOL 152 11/26/2022   TRIG 167 (H) 11/26/2022   HDL 39 (L) 11/26/2022   LDLCALC 84 11/26/2022   ALT 50 (H) 11/26/2022   AST 71 (H) 11/26/2022   NA 139 11/26/2022   K 4.1 11/26/2022   CL 100 11/26/2022   CREATININE 1.01 (H) 11/26/2022   BUN 13 11/26/2022   CO2 27 11/26/2022   TSH 0.827 11/26/2022   INR 1.1 (H) 03/07/2017   HGBA1C 6.8 (H) 11/26/2022    CT CORONARY MORPH W/CTA COR W/SCORE W/CA W/CM &/OR WO/CM  Addendum Date: 03/16/2022   ADDENDUM REPORT: 03/16/2022 10:49 EXAM: OVER-READ INTERPRETATION  PET-CT CHEST The following report is an over-read performed by radiologist Dr. Leatha Gilding [facility] on 03/16/2022. This over-read does not include interpretation of cardiac or coronary anatomy or pathology. The cardiac CT interpretation by the cardiologist is to be attached. COMPARISON:  None. FINDINGS: No evidence for lymphadenopathy within the visualized mediastinum or hilar regions. The visualized lung parenchyma shows no suspicious pulmonary nodule or mass. No focal airspace consolidation. No effusion. Visualized portions of the upper abdomen are unremarkable. No suspicious lytic or sclerotic osseous abnormality. IMPRESSION: No acute or clinically significant extracardiac findings. Electronically Signed   By: Kennith Center M.D.   On: 03/16/2022 10:49   Result Date: 03/16/2022 HISTORY: Chest pain, nonspecific EXAM: Cardiac/Coronary  CT TECHNIQUE: The patient was scanned on a [employer]. PROTOCOL: A 120 kV prospective scan was triggered in the descending thoracic aorta at 111 HU's. Axial non-contrast 3 mm slices were carried out through the heart. The data set was analyzed on a  dedicated work station and scored using the Agatston method. Gantry rotation speed was 250 msecs and collimation was .6 mm. Beta blockade and 0.8 mg of sl NTG was given. The 3D data set was reconstructed in 5% intervals of the 35-75 % of the R-R cycle. Systolic and diastolic phases were analyzed on a dedicated work station using MPR, MIP and VRT modes. The patient received contrast: OMNIPAQUE IOHEXOL 350 MG/ML SOLN. FINDINGS: Image quality: Good Noise artifact is: Mild motion artifact  and reduced SNR. Coronary calcium score is 0. Coronary arteries: Normal coronary origins.  Co-dominance. Right Coronary Artery: No detectable plaque or stenosis. Left Main Coronary Artery: No detectable plaque or stenosis. Left Anterior Descending Coronary Artery: No detectable plaque or stenosis. Left Circumflex Artery: No detectable plaque or stenosis. Motion artifact is present in the area of OM2 noted by Roadmap analysis. Aorta: Normal size, 30 mm at the mid ascending aorta (level of the PA bifurcation) measured double oblique. Trivial calcifications. No dissection. Aortic Valve: No calcifications.  Tricuspid aortic valve. Other findings: Normal pulmonary vein drainage into the left atrium. Normal left atrial appendage without thrombus. Normal size of the pulmonary artery. Please see separate report from [facility] for non-cardiac findings. IMPRESSION: 1. No evidence of CAD, 0% stenosis, CADRADS 0. 2. Coronary calcium score of 0. 3. Normal coronary origins with co-dominance. RECOMMENDATIONS: CAD-RADS 0. No evidence of CAD (0%). Consider non-atherosclerotic causes of chest pain. Electronically Signed: By: Weston Brass M.D. 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    Dispense:  3 mL    Refill:  3      Follow-up: Return in about 3 months (around 05/23/2023) for a follow-up visit.  Sonda Primes, MD

## 2023-02-21 NOTE — Assessment & Plan Note (Signed)
Blue-Emu cream -- use 2-3 times a day ? ?

## 2023-02-21 NOTE — Assessment & Plan Note (Signed)
Blue-Emu cream -- use 2-3 times a day ? ?

## 2023-02-21 NOTE — Patient Instructions (Signed)
Blue-Emu cream -- use 2-3 times a day ? ?

## 2023-02-21 NOTE — Progress Notes (Cosign Needed Addendum)
Subjective:   Kristy Keller is a 62 y.o. female who presents for an Initial Medicare Annual Wellness Visit.  Visit Complete: In person  Patient Medicare AWV questionnaire was completed by the patient on 02/17/2023; I have confirmed that all information answered by patient is correct and no changes since this date.  Cardiac Risk Factors include: advanced age (>62men, >62 women);hypertension     Objective:    Today's Vitals   02/21/23 0815  Weight: 233 lb 12.8 oz (106.1 kg)  Height: 5\' 5"  (1.651 m)   Body mass index is 38.91 kg/m.     02/21/2023    8:21 AM  Advanced Directives  Does Patient Have a Medical Advance Directive? Yes  Type of Estate agent of [place];Living will  Copy of Healthcare Power of Attorney in Chart? No - copy requested    Current Medications (verified) Outpatient Encounter Medications as of 02/21/2023  Medication Sig   acetaminophen (TYLENOL) 500 MG tablet Take 500 mg by mouth every 5 (five) hours as needed for headache.   aspirin EC 81 MG tablet Take 1 tablet (81 mg total) by mouth daily. Swallow whole.   Cholecalciferol (VITAMIN D3) 2000 units capsule Take 1 capsule (2,000 Units total) by mouth daily.   furosemide (LASIX) 40 MG tablet Take 40 mg by mouth 2 (two) times daily.   gabapentin (NEURONTIN) 300 MG capsule Take 300 mg by mouth at bedtime.   metoprolol succinate (TOPROL-XL) 25 MG 24 hr tablet Take 1 tablet (25 mg total) by mouth at bedtime.   nitroGLYCERIN (NITROSTAT) 0.4 MG SL tablet Place 0.4 mg under the tongue every 5 (five) minutes as needed for chest pain.   Semaglutide,0.25 or 0.5MG /DOS, (OZEMPIC, 0.25 OR 0.5 MG/DOSE,) 2 MG/3ML SOPN Use 0.25 mg weekly sq for 1 month, then 0.5 mg sq weekly   zinc sulfate 220 (50 Zn) MG capsule Take 220 mg by mouth daily.   No facility-administered encounter medications on file as of 02/21/2023.    Allergies (verified) Ketorolac, Gabapentin, and Lyrica [pregabalin]    History: Past Medical History:  Diagnosis Date   Age-related nuclear cataract, bilateral 06/04/2021   Angina pectoris ([name]) 02/28/2022   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 Asymptomatic, with borderline but controlled blood pressure. Advised to continue monitoring her blood pressure, and to continue with dietary modifications. No pharmacological changes to her management at this visit.   Breast lump 02/21/2018   Cancer of skin, squamous cell 03/12/2019   Formatting of this note might be different from the original. possible right peri orbital   Cat bite of hand, right, sequela 12/21/2020   Cellulitis of right hand 12/08/2020   Formatting of this note might be different from the original. Added automatically from request for surgery [id number]   Chest pain 02/19/2022   Chicken pox    Chronic venous insufficiency 11/07/2017   2019 B Support socks knee high Elevate legs Low salt diet Loose 10-20 lbs Spironolactone   Class 2 severe obesity due to excess calories with serious comorbidity in adult [facility]) 10/06/2019   Classic migraine with aura 03/12/2019   Edema 06/05/2017   Due to gabapentin - resolved off Rx 11/18  Spironolactone   Family history of early CAD 03/05/2017   Baby ASA qd   Family history of heart disease in brother 02/19/2022   Gallbladder pain 02/21/2018  Gallstone 03/05/2017   2014 large 2018 [place]: Cholelithiasis with confluence of gallstones measuring 2.5 cm in length, similar to prior study. No gallbladder wall thickening or pericholecystic fluid   History of hysterectomy 09/04/2022   Hypermetropia of both eyes 03/12/2019   Hypokalemia 02/19/2022   Infected sebaceous cyst 07/19/2017   2/19 back   Insomnia 12/30/2017   2019 refractory Zolpidem prn - not taking Zolpidem  Potential benefits of a long term benzodiazepines  use as  well as potential risks  and complications were explained to the patient and were aknowledged.     Intertrigo 09/04/2022   Localized swelling, mass, or lump of lower extremity, bilateral 01/17/2022   Last Assessment & Plan:  Formatting of this note might be different from the original. The symmetric swelling and erythema, as well has her orthopnea, point towards a cardiac etiology as opposed to deep venous thrombosis. Worsening chronic venous insufficiency is possibly the sole cause of this, and at least a contributory etiology.  CBC, BMP, and pro-BNP ordered. Prescription for 40 mg of Lasix,    Low back pain 09/04/2022   with radiculopathy with radiculopathy   Morbid obesity ([name]) 02/28/2022   Multilevel lumbosacral spondylosis with radiculopathy 04/12/2017   Nodule of left external ear 10/23/2021   Last Assessment & Plan:  Formatting of this note might be different from the original. Benign examination; possibly an area of thickened skin due to friction, dryness, and repeated manipulation by the patient. Advised she avoid alcohol application and use of neosporin. Suggested she try moisturizer to avoid dryness and further irritation.   Nonalcoholic fatty liver disease 02/19/2022   Nuclear sclerotic cataract of both eyes 03/12/2019   OAG (open angle glaucoma) suspect, low risk, bilateral 03/12/2019   Orthopnea 01/17/2022   Last Assessment & Plan:  Formatting of this note might be different from the original. Her cardiac workup in the hospital was reassuring, though her orthopnea and increased swelling in her extremities are concerning. Her lung exam is reassuring, but in consideration of recent events, a plain radiograph of the chest, CBC, BMP, and pro-BNP have been ordered.  Increasing her Lasix to 40 mg twice dail   Pasteurella cellulitis due to cat bite 12/21/2020   Postlaminectomy syndrome of lumbar region 05/19/2021   Formatting of this note might be different from the original. Added  automatically from request for surgery [id number]   Preop exam for internal medicine 03/05/2017   [name] is medically clear for her L4 decompression/fusion. Thank you! EKG - nl Labs, CXR - OK Stress test discussed. [name] declined. She was very active prior to her radiculopathy w/o CPs Baby ASA qd   Presbyopia 03/12/2019   Pseudophakia of left eye 07/15/2022   Pseudophakia of right eye 07/15/2022   Pyogenic arthritis of hand ([name]) 12/21/2020   Formatting of this note might be different from the original. Added automatically from request for surgery [id number]   Radiculopathy of leg 03/06/2017   01/2017 Dr Precious Gilding  LBP - s/p L4 decompr and fusion surgery Apr 10, 2017. LBP and RLE pain is better Flexeril Loose weight Join a gentle yoga class   Regular astigmatism of both eyes 03/12/2019   Rupture of extensor tendon of right hand 12/09/2020   Formatting of this note might be different from the original. S/p repair Dr Tanja Port 12-09-2020   Seborrheic keratosis 09/04/2022   Spinal stenosis of lumbar region with neurogenic claudication 06/07/2021   SVT (supraventricular tachycardia) 11/17/2019   Last Assessment & Plan:  Formatting  of this note might be different from the original. Examination suggests she's experiencing supraventricular tachycardia again. Attempted a carotid sinus massage, after explaining the process and obtaining verbal consent, of each side for 10-15 seconds. Failure to convert her back to normal rate despite multiple attempts.   Advised that she'd have to go to the ED   Unstable angina ([name]) 02/19/2022   Past Surgical History:  Procedure Laterality Date   ABDOMINAL HYSTERECTOMY     partial   EYE SURGERY Bilateral 05/2022   Cataract   Family History  Problem Relation Age of Onset   Heart disease Mother 35       CHF, CAD   Cancer Father 58       lung ca   Diabetes Sister    Heart disease Brother 56       CAD, CABG   Social History   Socioeconomic History   Marital status:  Married    Spouse name: Not on file   Number of children: Not on file   Years of education: Not on file   Highest education level: Not on file  Occupational History   Occupation: Disabled  Tobacco Use   Smoking status: Former   Smokeless tobacco: Never  Advertising account planner   Vaping status: Never Used  Substance and Sexual Activity   Alcohol use: No   Drug use: No   Sexual activity: Never    Partners: Male  Other Topics Concern   Not on file  Social History Narrative   Not on file   Social Determinants of Health   Financial Resource Strain: Low Risk  (02/17/2023)   Overall Financial Resource Strain (CARDIA)    Difficulty of Paying Living Expenses: Not hard at all  Food Insecurity: No Food Insecurity (02/17/2023)   Hunger Vital Sign    Worried About Running Out of Food in the Last Year: Never true    Ran Out of Food in the Last Year: Never true  Transportation Needs: No Transportation Needs (02/17/2023)   PRAPARE - Administrator, Civil Service (Medical): No    Lack of Transportation (Non-Medical): No  Physical Activity: Inactive (02/17/2023)   Exercise Vital Sign    Days of Exercise per Week: 0 days    Minutes of Exercise per Session: 0 min  Stress: No Stress Concern Present (02/17/2023)   [employer] of Occupational Health - Occupational Stress Questionnaire    Feeling of Stress : Not at all  Social Connections: Unknown (02/17/2023)   Social Connection and Isolation Panel [NHANES]    Frequency of Communication with Friends and Family: Once a week    Frequency of Social Gatherings with Friends and Family: Patient declined    Attends Religious Services: Patient declined    Database administrator or Organizations: No    Attends Engineer, structural: Never    Marital Status: Married    Tobacco Counseling Counseling given: Not Answered   Clinical Intake:  Pre-visit preparation completed: Yes  Pain : No/denies pain     BMI - recorded:  38.91 Nutritional Risks: None Diabetes: No  How often do you need to have someone help you when you read instructions, pamphlets, or other written materials from your doctor or pharmacy?: 1 - Never  Interpreter Needed?: No  Information entered by :: Lamondre Wesche, RMA   Activities of Daily Living    02/17/2023    9:27 AM  In your present state of health, do you have any difficulty  performing the following activities:  Hearing? 0  Vision? 0  Difficulty concentrating or making decisions? 0  Walking or climbing stairs? 0  Dressing or bathing? 0  Doing errands, shopping? 0  Preparing Food and eating ? N  Using the Toilet? N  In the past six months, have you accidently leaked urine? N  Do you have problems with loss of bowel control? N  Managing your Medications? N  Managing your Finances? N  Housekeeping or managing your Housekeeping? N    Patient Care Team: Plotnikov, Georgina Quint, MD as PCP - General (Internal Medicine) Levi Aland, MD as Consulting Physician (Obstetrics and Gynecology) Adelfa Koh, MD as Referring Physician (Orthopedic Surgery) Adelfa Koh, MD as Referring Physician (Orthopedic Surgery) Revankar, Aundra Dubin, MD as Consulting Physician (Cardiology)  Indicate any recent Medical Services you may have received from other than [facility] providers in the past year (date may be approximate).     Assessment:   This is a routine wellness examination for [place].  Hearing/Vision screen Hearing Screening - Comments:: Denies hearing difficulties   Vision Screening - Comments:: Cataract surgery   Goals Addressed               This Visit's Progress     Patient Stated (pt-stated)        Would like to lose weight.      Depression Screen    12/03/2022   10:41 AM 03/05/2017   10:41 AM  PHQ 2/9 Scores  PHQ - 2 Score 0 0    Fall Risk    02/17/2023    9:27 AM 12/03/2022   10:41 AM  Fall Risk   Falls in the past year? 0 0   Number falls in past yr:  0  Injury with Fall?  0  Risk for fall due to : No Fall Risks No Fall Risks  Follow up Falls evaluation completed;Follow up appointment Falls evaluation completed    MEDICARE RISK AT HOME: Medicare Risk at Home Any stairs in or around the home?: Yes If so, are there any without handrails?: No Home free of loose throw rugs in walkways, pet beds, electrical cords, etc?: Yes Adequate lighting in your home to reduce risk of falls?: Yes Life alert?: No Use of a cane, walker or w/c?: No Grab bars in the bathroom?: Yes Shower chair or bench in shower?: Yes Elevated toilet seat or a handicapped toilet?: No  TIMED UP AND GO:  Was the test performed? Yes  Length of time to ambulate 10 feet: 10 sec Gait steady and fast without use of assistive device    Cognitive Function:        02/21/2023    8:22 AM  6CIT Screen  What Year? 0 points  What month? 0 points  What time? 0 points    Immunizations Immunization History  Administered Date(s) Administered   Hepatitis B 06/04/2012   Pneumococcal Polysaccharide-23 05/19/2013   Tdap 03/06/2015    TDAP status: Up to date  Flu Vaccine status: Declined, Education has been provided regarding the importance of this vaccine but patient still declined. Advised may receive this vaccine at local pharmacy or Health Dept. Aware to provide a copy of the vaccination record if obtained from local pharmacy or Health Dept. Verbalized acceptance and understanding.  Pneumococcal vaccine status: Due, Education has been provided regarding the importance of this vaccine. Advised may receive this vaccine at local pharmacy or Health Dept. Aware to provide a copy of the  vaccination record if obtained from local pharmacy or Health Dept. Verbalized acceptance and understanding.  Covid-19 vaccine status: Declined, Education has been provided regarding the importance of this vaccine but patient still declined. Advised may receive this  vaccine at local pharmacy or Health Dept.or vaccine clinic. Aware to provide a copy of the vaccination record if obtained from local pharmacy or Health Dept. Verbalized acceptance and understanding.  Qualifies for Shingles Vaccine? Yes   Zostavax completed No   Shingrix Completed?: No.    Education has been provided regarding the importance of this vaccine. Patient has been advised to call insurance company to determine out of pocket expense if they have not yet received this vaccine. Advised may also receive vaccine at local pharmacy or Health Dept. Verbalized acceptance and understanding.  Screening Tests Health Maintenance  Topic Date Due   COVID-19 Vaccine (1) Never done   FOOT EXAM  Never done   OPHTHALMOLOGY EXAM  Never done   HIV Screening  Never done   Diabetic kidney evaluation - Urine ACR  Never done   Hepatitis C Screening  Never done   Zoster Vaccines- Shingrix (1 of 2) Never done   Colonoscopy  Never done   MAMMOGRAM  07/07/2017   INFLUENZA VACCINE  Never done   HEMOGLOBIN A1C  05/28/2023   Diabetic kidney evaluation - eGFR measurement  11/26/2023   Medicare Annual Wellness (AWV)  02/21/2024   DTaP/Tdap/Td (2 - Td or Tdap) 03/05/2025   HPV VACCINES  Aged Out    Health Maintenance  Health Maintenance Due  Topic Date Due   COVID-19 Vaccine (1) Never done   FOOT EXAM  Never done   OPHTHALMOLOGY EXAM  Never done   HIV Screening  Never done   Diabetic kidney evaluation - Urine ACR  Never done   Hepatitis C Screening  Never done   Zoster Vaccines- Shingrix (1 of 2) Never done   Colonoscopy  Never done   MAMMOGRAM  07/07/2017   INFLUENZA VACCINE  Never done    Colorectal cancer screening: Referral to GI placed Patient has an appointment scheduled. Pt aware the office will call re: appt.  Mammogram status: Ordered 02/21/2023. Pt provided with contact info and advised to call to schedule appt.    Lung Cancer Screening: (Low Dose CT Chest recommended if Age 74-80  years, 20 pack-year currently smoking OR have quit w/in 15years.) does not qualify.   Lung Cancer Screening Referral: N/A  Additional Screening:  Hepatitis C Screening: does qualify;   Vision Screening: Recommended annual ophthalmology exams for early detection of glaucoma and other disorders of the eye. Is the patient up to date with their annual eye exam?  Yes  Who is the provider or what is the name of the office in which the patient attends annual eye exams? Dr. Arnetha Gula If pt is not established with a provider, would they like to be referred to a provider to establish care? No .   Dental Screening: Recommended annual dental exams for proper oral hygiene  Diabetic Foot Exam: Diabetic Foot Exam: Overdue, Pt has been advised about the importance in completing this exam. Pt is scheduled for diabetic foot exam on 02/21/2023.  Community Resource Referral / Chronic Care Management: CRR required this visit?  No   CCM required this visit?  No     Plan:     I have personally reviewed and noted the following in the patient's chart:   Medical and social history Use of alcohol, tobacco  or illicit drugs  Current medications and supplements including opioid prescriptions. Patient is not currently taking opioid prescriptions. Functional ability and status Nutritional status Physical activity Advanced directives List of other physicians Hospitalizations, surgeries, and ER visits in previous 12 months Vitals Screenings to include cognitive, depression, and falls Referrals and appointments  In addition, I have reviewed and discussed with patient certain preventive protocols, quality metrics, and best practice recommendations. A written personalized care plan for preventive services as well as general preventive health recommendations were provided to patient.     Kenda Kloehn L Xenia Nile, CMA   02/21/2023   After Visit Summary: (MyChart) Due to this being a telephonic visit, the after  visit summary with patients personalized plan was offered to patient via MyChart   Nurse Notes: Patient declines all vaccines.  She is due for mammogram, and a colonoscopy.  Patient has an appointment for the colonoscopy on 03/29/2023.   I have placed an order for the mammogram today.  She is up to date with her eye exam, which I have sent a letter for her records today. Patient is due for a Hep C screening, HIV Screening and a Diabetic Kidney evaluation.  She has an appointment today with Dr. Posey Rea.       Medical screening examination/treatment/procedure(s) were performed by non-physician practitioner and as supervising physician I was immediately available for consultation/collaboration.  I agree with above. Jacinta Shoe, MD

## 2023-02-21 NOTE — Patient Instructions (Signed)
Kristy Keller , Thank you for taking time to come for your Medicare Wellness Visit. I appreciate your ongoing commitment to your health goals. Please review the following plan we discussed and let me know if I can assist you in the future.   Referrals/Orders/Follow-Ups/Clinician Recommendations: You are due for a mammogram, please call [facility],  [address], [address], [place], [place] [address] at [phone] to schedule an appointment.  It was nice to talk with you today.  Keep up the good work.  This is a list of the screening recommended for you and due dates:  Health Maintenance  Topic Date Due   Complete foot exam   Never done   Eye exam for diabetics  Never done   HIV Screening  Never done   Yearly kidney health urinalysis for diabetes  Never done   Hepatitis C Screening  Never done   Colon Cancer Screening  Never done   Mammogram  07/07/2017   COVID-19 Vaccine (1) 03/09/2023*   Zoster (Shingles) Vaccine (1 of 2) 05/23/2023*   Flu Shot  09/02/2023*   Hemoglobin A1C  05/28/2023   Yearly kidney function blood test for diabetes  11/26/2023   Medicare Annual Wellness Visit  02/21/2024   DTaP/Tdap/Td vaccine (2 - Td or Tdap) 03/05/2025   HPV Vaccine  Aged Out  *Topic was postponed. The date shown is not the original due date.    Advanced directives: (Copy Requested) Please bring a copy of your health care power of attorney and living will to the office to be added to your chart at your convenience.  Next Medicare Annual Wellness Visit scheduled for next year: No

## 2023-02-21 NOTE — Assessment & Plan Note (Signed)
  Increase Ozempic to 1 mg/d

## 2023-02-25 NOTE — Assessment & Plan Note (Signed)
Discussed diet, intermittent fasting

## 2023-03-04 ENCOUNTER — Other Ambulatory Visit: Payer: Self-pay | Admitting: Internal Medicine

## 2023-03-20 ENCOUNTER — Ambulatory Visit
Admission: RE | Admit: 2023-03-20 | Discharge: 2023-03-20 | Disposition: A | Discharge status: Home / Self Care | Payer: Medicare HMO | Source: Ambulatory Visit | Attending: Internal Medicine | Admitting: Internal Medicine

## 2023-03-20 DIAGNOSIS — Z1231 Encounter for screening mammogram for malignant neoplasm of breast: Secondary | ICD-10-CM | POA: Diagnosis not present

## 2023-03-22 ENCOUNTER — Telehealth: Payer: Self-pay | Admitting: Internal Medicine

## 2023-03-22 NOTE — Telephone Encounter (Signed)
Patient called and said that the ozempic 1 mg. Is working and not bothering her.  Please send more

## 2023-03-25 ENCOUNTER — Other Ambulatory Visit: Payer: Self-pay

## 2023-03-25 MED ORDER — SEMAGLUTIDE (1 MG/DOSE) 4 MG/3ML ~~LOC~~ SOPN: 1.0000 mg | PEN_INJECTOR | SUBCUTANEOUS | 3 refills | Status: DC

## 2023-03-25 NOTE — Telephone Encounter (Signed)
Rx has been sent in for the pt.

## 2023-03-29 ENCOUNTER — Ambulatory Visit: Payer: Medicare HMO | Admitting: Gastroenterology

## 2023-03-29 ENCOUNTER — Encounter: Payer: Self-pay | Admitting: Gastroenterology

## 2023-03-29 VITALS — BP 130/69 | HR 76 | Temp 98.6°F | Resp 9 | Ht 65.0 in | Wt 234.0 lb

## 2023-03-29 DIAGNOSIS — D122 Benign neoplasm of ascending colon: Secondary | ICD-10-CM

## 2023-03-29 DIAGNOSIS — Z1211 Encounter for screening for malignant neoplasm of colon: Secondary | ICD-10-CM

## 2023-03-29 DIAGNOSIS — K635 Polyp of colon: Secondary | ICD-10-CM | POA: Diagnosis not present

## 2023-03-29 DIAGNOSIS — I10 Essential (primary) hypertension: Secondary | ICD-10-CM | POA: Diagnosis not present

## 2023-03-29 DIAGNOSIS — E66812 Obesity, class 2: Secondary | ICD-10-CM | POA: Diagnosis not present

## 2023-03-29 DIAGNOSIS — E119 Type 2 diabetes mellitus without complications: Secondary | ICD-10-CM | POA: Diagnosis not present

## 2023-03-29 DIAGNOSIS — D125 Benign neoplasm of sigmoid colon: Secondary | ICD-10-CM

## 2023-03-29 DIAGNOSIS — D123 Benign neoplasm of transverse colon: Secondary | ICD-10-CM | POA: Diagnosis not present

## 2023-03-29 MED ORDER — SODIUM CHLORIDE 0.9 % IV SOLN: 500.0000 mL | INTRAVENOUS | Status: DC

## 2023-03-29 NOTE — Patient Instructions (Addendum)
Resume all of your previous medications as ordered.  You will need a repeat colonoscopy in 3-5 years.  Be sure to eat fiber and drink plenty of water.  Read all of the handouts given to you by your recovery room nurse.  YOU HAD AN ENDOSCOPIC PROCEDURE TODAY AT THE [facility]:   Refer to the procedure report that was given to you for any specific questions about what was found during the examination.  If the procedure report does not answer your questions, please call your gastroenterologist to clarify.  If you requested that your care partner not be given the details of your procedure findings, then the procedure report has been included in a sealed envelope for you to review at your convenience later.  YOU SHOULD EXPECT: Some feelings of bloating in the abdomen. Passage of more gas than usual.  Walking can help get rid of the air that was put into your GI tract during the procedure and reduce the bloating. If you had a lower endoscopy (such as a colonoscopy or flexible sigmoidoscopy) you may notice spotting of blood in your stool or on the toilet paper. If you underwent a bowel prep for your procedure, you may not have a normal bowel movement for a few days.  Please Note:  You might notice some irritation and congestion in your nose or some drainage.  This is from the oxygen used during your procedure.  There is no need for concern and it should clear up in a day or so.  SYMPTOMS TO REPORT IMMEDIATELY:  Following lower endoscopy (colonoscopy or flexible sigmoidoscopy):  Excessive amounts of blood in the stool  Significant tenderness or worsening of abdominal pains  Swelling of the abdomen that is new, acute  Fever of 100F or higher   For urgent or emergent issues, a gastroenterologist can be reached at any hour by calling [phone]. Do not use MyChart messaging for urgent concerns.    DIET:  We do recommend a small meal at first, but then you may proceed to your regular  diet.  Drink plenty of fluids but you should avoid alcoholic beverages for 24 hours.  ACTIVITY:  You should plan to take it easy for the rest of today and you should NOT DRIVE or use heavy machinery until tomorrow (because of the sedation medicines used during the test).    FOLLOW UP: Our staff will call the number listed on your records the next business day following your procedure.  We will call around 7:15- 8:00 am to check on you and address any questions or concerns that you may have regarding the information given to you following your procedure. If we do not reach you, we will leave a message.     If any biopsies were taken you will be contacted by phone or by letter within the next 1-3 weeks.  Please call us at [phone] if you have not heard about the biopsies in 3 weeks.    SIGNATURES/CONFIDENTIALITY: You and/or your care partner have signed paperwork which will be entered into your electronic medical record.  These signatures attest to the fact that that the information above on your After Visit Summary has been reviewed and is understood.  Full responsibility of the confidentiality of this discharge information lies with you and/or your care-partner.

## 2023-03-29 NOTE — Progress Notes (Signed)
Called to room to assist during endoscopic procedure.  Patient ID and intended procedure confirmed with present staff. Received instructions for my participation in the procedure from the performing physician.  

## 2023-03-29 NOTE — Progress Notes (Signed)
[facility] History and Physical   Primary Care Physician:  Plotnikov, Georgina Quint, MD   Reason for Procedure:  Colorectal cancer screening  Plan:    Screening colonoscopy with possible interventions as needed     HPI: Kristy Keller is a very pleasant 62 y.o. female here for screening colonoscopy. Denies any nausea, vomiting, abdominal pain, melena or bright red blood per rectum  The risks and benefits as well as alternatives of endoscopic procedure(s) have been discussed and reviewed. All questions answered. The patient agrees to proceed.    Past Medical History:  Diagnosis Date   Age-related nuclear cataract, bilateral 06/04/2021   Allergy    Angina pectoris ([name]) 02/28/2022   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 Asymptomatic, with borderline but controlled blood pressure. Advised to continue monitoring her blood pressure, and to continue with dietary modifications. No pharmacological changes to her management at this visit.   Breast lump 02/21/2018   Cancer of skin, squamous cell 03/12/2019   Formatting of this note might be different from the original. possible right peri orbital   Cat bite of hand, right, sequela 12/21/2020   Cellulitis of right hand 12/08/2020   Formatting of this note might be different from the original. Added automatically from request for surgery [id number]   Chest pain 02/19/2022   Chicken pox    Chronic venous insufficiency 11/07/2017   2019 B Support socks knee high Elevate legs Low salt diet Loose 10-20 lbs Spironolactone   Class 2 severe obesity due to excess calories with serious comorbidity in adult [facility]) 10/06/2019   Classic migraine with aura 03/12/2019   Diabetes mellitus without complication ([name])    Edema 06/05/2017   Due to gabapentin - resolved off Rx 11/18  Spironolactone    Family history of early CAD 03/05/2017   Baby ASA qd   Family history of heart disease in brother 02/19/2022   Gallbladder pain 02/21/2018   Gallstone 03/05/2017   2014 large 2018 [place]: Cholelithiasis with confluence of gallstones measuring 2.5 cm in length, similar to prior study. No gallbladder wall thickening or pericholecystic fluid   History of hysterectomy 09/04/2022   Hypermetropia of both eyes 03/12/2019   Hypokalemia 02/19/2022   Infected sebaceous cyst 07/19/2017   2/19 back   Insomnia 12/30/2017   2019 refractory Zolpidem prn - not taking Zolpidem  Potential benefits of a long term benzodiazepines  use as well as potential risks  and complications were explained to the patient and were aknowledged.     Intertrigo 09/04/2022   Localized swelling, mass, or lump of lower extremity, bilateral 01/17/2022   Last Assessment & Plan:  Formatting of this note might be different from the original. The symmetric swelling and erythema, as well has her orthopnea, point towards a cardiac etiology as opposed to deep venous thrombosis. Worsening chronic venous insufficiency is possibly the sole cause of this, and at least a contributory etiology.  CBC, BMP, and pro-BNP ordered. Prescription for 40 mg of Lasix,    Low back pain 09/04/2022   with radiculopathy with radiculopathy   Morbid obesity ([name]) 02/28/2022   Multilevel lumbosacral spondylosis with radiculopathy 04/12/2017   Nodule of left external ear 10/23/2021   Last Assessment & Plan:  Formatting of this note might be different from the original. Benign examination; possibly an area of thickened skin due to friction, dryness, and  repeated manipulation by the patient. Advised she avoid alcohol application and use of neosporin. Suggested she try moisturizer to avoid dryness and further irritation.   Nonalcoholic fatty liver disease 02/19/2022   Nuclear sclerotic cataract of both eyes 03/12/2019   OAG (open angle glaucoma) suspect, low risk,  bilateral 03/12/2019   Orthopnea 01/17/2022   Last Assessment & Plan:  Formatting of this note might be different from the original. Her cardiac workup in the hospital was reassuring, though her orthopnea and increased swelling in her extremities are concerning. Her lung exam is reassuring, but in consideration of recent events, a plain radiograph of the chest, CBC, BMP, and pro-BNP have been ordered.  Increasing her Lasix to 40 mg twice dail   Pasteurella cellulitis due to cat bite 12/21/2020   Postlaminectomy syndrome of lumbar region 05/19/2021   Formatting of this note might be different from the original. Added automatically from request for surgery [id number]   Preop exam for internal medicine 03/05/2017   [name] is medically clear for her L4 decompression/fusion. Thank you! EKG - nl Labs, CXR - OK Stress test discussed. [name] declined. She was very active prior to her radiculopathy w/o CPs Baby ASA qd   Presbyopia 03/12/2019   Pseudophakia of left eye 07/15/2022   Pseudophakia of right eye 07/15/2022   Pyogenic arthritis of hand ([name]) 12/21/2020   Formatting of this note might be different from the original. Added automatically from request for surgery [id number]   Radiculopathy of leg 03/06/2017   01/2017 Dr Precious Gilding  LBP - s/p L4 decompr and fusion surgery Apr 10, 2017. LBP and RLE pain is better Flexeril Loose weight Join a gentle yoga class   Regular astigmatism of both eyes 03/12/2019   Rupture of extensor tendon of right hand 12/09/2020   Formatting of this note might be different from the original. S/p repair Dr Tanja Port 12-09-2020   Seborrheic keratosis 09/04/2022   Spinal stenosis of lumbar region with neurogenic claudication 06/07/2021   SVT (supraventricular tachycardia) ([name]) 11/17/2019   Last Assessment & Plan:  Formatting of this note might be different from the original. Examination suggests she's experiencing supraventricular tachycardia again. Attempted a carotid sinus massage, after  explaining the process and obtaining verbal consent, of each side for 10-15 seconds. Failure to convert her back to normal rate despite multiple attempts.   Advised that she'd have to go to the ED   Unstable angina ([name]) 02/19/2022    Past Surgical History:  Procedure Laterality Date   ABDOMINAL HYSTERECTOMY     partial   EYE SURGERY Bilateral 05/2022   Cataract    Prior to Admission medications   Medication Sig Start Date End Date Taking? Authorizing Provider  acetaminophen (TYLENOL) 500 MG tablet Take 500 mg by mouth every 5 (five) hours as needed for headache.   Yes [provider]  aspirin EC 81 MG tablet Take 1 tablet (81 mg total) by mouth daily. Swallow whole. 02/28/22  Yes Revankar, Aundra Dubin, MD  Cholecalciferol (VITAMIN D3) 2000 units capsule Take 1 capsule (2,000 Units total) by mouth daily. 03/05/17  Yes Plotnikov, Georgina Quint, MD  furosemide (LASIX) 40 MG tablet TAKE 1 TABLET (40 MG TOTAL) BY MOUTH 2 TIMES DAILY. **PT NEEDS APPT FOR REFILL 03/04/23  Yes Plotnikov, Georgina Quint, MD  gabapentin (NEURONTIN) 300 MG capsule Take 300 mg by mouth at bedtime.   Yes [provider]  metoprolol succinate (TOPROL-XL) 25 MG 24 hr tablet Take 1 tablet (25 mg total) by  mouth at bedtime. 12/03/22  Yes Plotnikov, Georgina Quint, MD  Semaglutide, 1 MG/DOSE, 4 MG/3ML SOPN Inject 1 mg as directed once a week. 03/25/23  Yes Plotnikov, Georgina Quint, MD  zinc sulfate 220 (50 Zn) MG capsule Take 220 mg by mouth daily.   Yes [provider]  nitroGLYCERIN (NITROSTAT) 0.4 MG SL tablet Place 0.4 mg under the tongue every 5 (five) minutes as needed for chest pain. 02/28/22   [provider]    Current Outpatient Medications  Medication Sig Dispense Refill   acetaminophen (TYLENOL) 500 MG tablet Take 500 mg by mouth every 5 (five) hours as needed for headache.     aspirin EC 81 MG tablet Take 1 tablet (81 mg total) by mouth daily. Swallow whole. 90 tablet 3   Cholecalciferol (VITAMIN D3)  2000 units capsule Take 1 capsule (2,000 Units total) by mouth daily. 100 capsule 3   furosemide (LASIX) 40 MG tablet TAKE 1 TABLET (40 MG TOTAL) BY MOUTH 2 TIMES DAILY. **PT NEEDS APPT FOR REFILL 30 tablet 0   gabapentin (NEURONTIN) 300 MG capsule Take 300 mg by mouth at bedtime.     metoprolol succinate (TOPROL-XL) 25 MG 24 hr tablet Take 1 tablet (25 mg total) by mouth at bedtime. 90 tablet 3   Semaglutide, 1 MG/DOSE, 4 MG/3ML SOPN Inject 1 mg as directed once a week. 3 mL 3   zinc sulfate 220 (50 Zn) MG capsule Take 220 mg by mouth daily.     nitroGLYCERIN (NITROSTAT) 0.4 MG SL tablet Place 0.4 mg under the tongue every 5 (five) minutes as needed for chest pain.     Current Facility-Administered Medications  Medication Dose Route Frequency Provider Last Rate Last Admin   0.9 %  sodium chloride infusion  500 mL Intravenous Continuous Mardie Kellen, Eleonore Chiquito, MD        Allergies as of 03/29/2023 - Review Complete 03/29/2023  Allergen Reaction Noted   Ketorolac  06/07/2021   Gabapentin Swelling 06/05/2017   Lyrica [pregabalin] Other (See Comments) 12/30/2017    Family History  Problem Relation Age of Onset   Heart disease Mother 80       CHF, CAD   Cancer Father 48       lung ca   Diabetes Sister    Heart disease Brother 50       CAD, CABG   Colon cancer Neg Hx    Esophageal cancer Neg Hx    Rectal cancer Neg Hx    Stomach cancer Neg Hx     Social History   Socioeconomic History   Marital status: Married    Spouse name: Not on file   Number of children: Not on file   Years of education: Not on file   Highest education level: Not on file  Occupational History   Occupation: Disabled  Tobacco Use   Smoking status: Former   Smokeless tobacco: Never  Vaping Use   Vaping status: Never Used  Substance and Sexual Activity   Alcohol use: No   Drug use: No   Sexual activity: Never    Partners: Male  Other Topics Concern   Not on file  Social History Narrative   Not on  file   Social Determinants of Health   Financial Resource Strain: Low Risk  (02/17/2023)   Overall Financial Resource Strain (CARDIA)    Difficulty of Paying Living Expenses: Not hard at all  Food Insecurity: No Food Insecurity (02/17/2023)   Hunger Vital Sign  Worried About Programme researcher, broadcasting/film/video in the Last Year: Never true    Ran Out of Food in the Last Year: Never true  Transportation Needs: No Transportation Needs (02/17/2023)   PRAPARE - Administrator, Civil Service (Medical): No    Lack of Transportation (Non-Medical): No  Physical Activity: Inactive (02/17/2023)   Exercise Vital Sign    Days of Exercise per Week: 0 days    Minutes of Exercise per Session: 0 min  Stress: No Stress Concern Present (02/17/2023)   [employer] of Occupational Health - Occupational Stress Questionnaire    Feeling of Stress : Not at all  Social Connections: Unknown (02/17/2023)   Social Connection and Isolation Panel [NHANES]    Frequency of Communication with Friends and Family: Once a week    Frequency of Social Gatherings with Friends and Family: Patient declined    Attends Religious Services: Patient declined    Database administrator or Organizations: No    Attends Banker Meetings: Never    Marital Status: Married  Catering manager Violence: Not At Risk (02/21/2023)   Humiliation, Afraid, Rape, and Kick questionnaire    Fear of Current or Ex-Partner: No    Emotionally Abused: No    Physically Abused: No    Sexually Abused: No    Review of Systems:  All other review of systems negative except as mentioned in the HPI.  Physical Exam: Vital signs in last 24 hours: BP (!) 146/85   Pulse 87   Temp 98.6 F (37 C) (Temporal)   Resp 10   Ht 5\' 5"  (1.651 m)   Wt 234 lb (106.1 kg)   SpO2 99%   BMI 38.94 kg/m  General:   Alert, NAD Lungs:  Clear .   Heart:  Regular rate and rhythm Abdomen:  Soft, nontender and nondistended. Neuro/Psych:  Alert and  cooperative. Normal mood and affect. A and O x 3  Reviewed labs, radiology imaging, old records and pertinent past GI work up  Patient is appropriate for planned procedure(s) and anesthesia in an ambulatory setting   K. Scherry Ran , MD [phone]

## 2023-03-29 NOTE — Progress Notes (Signed)
Report given to PACU, vss 

## 2023-03-29 NOTE — Op Note (Signed)
[facility] Patient Name: Kristy Keller Procedure Date: 03/29/2023 7:59 AM MRN: [id number] Endoscopist: Napoleon Form , MD, [id number] Age: 62 Referring MD:  Date of Birth: 01/19/1961 Gender: Female Account #: [id number] Procedure:                Colonoscopy Indications:              Screening for colorectal malignant neoplasm, h/o                            Incidental - Diverticulitis Procedure:                Pre-Anesthesia Assessment:                           - Prior to the procedure, a History and Physical                            was performed, and patient medications and                            allergies were reviewed. The patient's tolerance of                            previous anesthesia was also reviewed. The risks                            and benefits of the procedure and the sedation                            options and risks were discussed with the patient.                            All questions were answered, and informed consent                            was obtained. Prior Anticoagulants: The patient has                            taken no anticoagulant or antiplatelet agents. ASA                            Grade Assessment: II - A patient with mild systemic                            disease. After reviewing the risks and benefits,                            the patient was deemed in satisfactory condition to                            undergo the procedure.                           After obtaining informed consent, the colonoscope  was passed under direct vision. Throughout the                            procedure, the patient's blood pressure, pulse, and                            oxygen saturations were monitored continuously. The                            Olympus Scope [id number] was introduced through the                            anus and advanced to the the cecum, identified by                             appendiceal orifice and ileocecal valve. The                            colonoscopy was performed without difficulty. The                            patient tolerated the procedure well. The quality                            of the bowel preparation was good. The ileocecal                            valve, appendiceal orifice, and rectum were                            photographed. Scope In: 8:09:58 AM Scope Out: 8:27:22 AM Scope Withdrawal Time: 0 hours 13 minutes 39 seconds  Total Procedure Duration: 0 hours 17 minutes 24 seconds  Findings:                 The perianal and digital rectal examinations were                            normal.                           Five sessile polyps were found in the sigmoid                            colon, transverse colon and ascending colon. The                            polyps were 3 to 6 mm in size. These polyps were                            removed with a cold snare. Resection and retrieval                            were complete.  A few small-mouthed diverticula were found in the                            sigmoid colon.                           Non-bleeding external and internal hemorrhoids were                            found during retroflexion. The hemorrhoids were                            small. Complications:            No immediate complications. Estimated Blood Loss:     Estimated blood loss was minimal. Impression:               - Five 3 to 6 mm polyps in the sigmoid colon, in                            the transverse colon and in the ascending colon,                            removed with a cold snare. Resected and retrieved.                           - Diverticulosis in the sigmoid colon.                           - Non-bleeding external and internal hemorrhoids. Recommendation:           - Patient has a contact number available for                            emergencies. The signs and  symptoms of potential                            delayed complications were discussed with the                            patient. Return to normal activities tomorrow.                            Written discharge instructions were provided to the                            patient.                           - Resume previous diet.                           - Continue present medications.                           - Await pathology results.                           -  Repeat colonoscopy in 3 - 5 years for                            surveillance based on pathology results. Napoleon Form, MD 03/29/2023 8:34:54 AM This report has been signed electronically.

## 2023-03-29 NOTE — Progress Notes (Signed)
0800 Patient experiencing nausea.  MD updated and Zofran 4 mg IV given, vss

## 2023-03-29 NOTE — Progress Notes (Signed)
0813 HR > 100 with esmolol 25 mg given IV, MD updated, vss

## 2023-04-01 ENCOUNTER — Telehealth: Payer: Self-pay

## 2023-04-01 ENCOUNTER — Telehealth: Payer: Self-pay | Admitting: Gastroenterology

## 2023-04-01 NOTE — Telephone Encounter (Signed)
Follow up call to pt, lm for pt to call if having any difficulty with normal activities or eating and drinking.  Also to call if any other questions or concerns.  

## 2023-04-01 NOTE — Telephone Encounter (Signed)
Inbound call from patient requesting to speak with a nurse. States she has not had a bowel movement since Friday 10/25. Please advise, thank you.

## 2023-04-01 NOTE — Telephone Encounter (Signed)
Returned pt's call. She is concerned that she has not had a BM since Friday 10/25, though she did have two loose BM's after arriving home from the colonoscopy; but nothing since. She states she takes Benefiber and stool softener daily as well as Magnesium. States she normally goes at least once if not twice a day. She denies bloating or distention. States she has occasional cramping that is relieved after passing gas. Informed pt that it can take a few days for her bowels to return to normal after bowel prep. Instructed pt to continue her bowel medications and high fiber diet. Since she is not having any symptoms/discomfort at this time, instructed pt to give it a few more days to see if she has a BM. Instructed pt to call back if she does not have a BM by Wednesday or if she develops any symptoms. Pt agreeable to plan of care.

## 2023-04-04 LAB — SURGICAL PATHOLOGY

## 2023-04-05 ENCOUNTER — Other Ambulatory Visit: Payer: Self-pay | Admitting: Internal Medicine

## 2023-04-08 ENCOUNTER — Encounter: Payer: Self-pay | Admitting: Gastroenterology

## 2023-04-10 DIAGNOSIS — E119 Type 2 diabetes mellitus without complications: Secondary | ICD-10-CM | POA: Diagnosis not present

## 2023-04-10 DIAGNOSIS — H26493 Other secondary cataract, bilateral: Secondary | ICD-10-CM | POA: Diagnosis not present

## 2023-04-10 DIAGNOSIS — Z961 Presence of intraocular lens: Secondary | ICD-10-CM | POA: Diagnosis not present

## 2023-04-10 DIAGNOSIS — H524 Presbyopia: Secondary | ICD-10-CM | POA: Diagnosis not present

## 2023-04-10 DIAGNOSIS — H40013 Open angle with borderline findings, low risk, bilateral: Secondary | ICD-10-CM | POA: Diagnosis not present

## 2023-04-10 DIAGNOSIS — H5201 Hypermetropia, right eye: Secondary | ICD-10-CM | POA: Diagnosis not present

## 2023-04-10 DIAGNOSIS — H04123 Dry eye syndrome of bilateral lacrimal glands: Secondary | ICD-10-CM | POA: Diagnosis not present

## 2023-04-10 DIAGNOSIS — H18523 Epithelial (juvenile) corneal dystrophy, bilateral: Secondary | ICD-10-CM | POA: Diagnosis not present

## 2023-04-10 DIAGNOSIS — H52203 Unspecified astigmatism, bilateral: Secondary | ICD-10-CM | POA: Diagnosis not present

## 2023-05-07 ENCOUNTER — Encounter: Payer: Self-pay | Admitting: Internal Medicine

## 2023-05-07 ENCOUNTER — Ambulatory Visit (INDEPENDENT_AMBULATORY_CARE_PROVIDER_SITE_OTHER): Payer: Medicare HMO | Admitting: Internal Medicine

## 2023-05-07 VITALS — BP 130/76 | HR 66 | Temp 98.0°F | Ht 65.0 in | Wt 235.0 lb

## 2023-05-07 DIAGNOSIS — M5442 Lumbago with sciatica, left side: Secondary | ICD-10-CM | POA: Diagnosis not present

## 2023-05-07 DIAGNOSIS — E118 Type 2 diabetes mellitus with unspecified complications: Secondary | ICD-10-CM

## 2023-05-07 DIAGNOSIS — G8929 Other chronic pain: Secondary | ICD-10-CM | POA: Diagnosis not present

## 2023-05-07 DIAGNOSIS — E66812 Obesity, class 2: Secondary | ICD-10-CM

## 2023-05-07 DIAGNOSIS — Z7985 Long-term (current) use of injectable non-insulin antidiabetic drugs: Secondary | ICD-10-CM

## 2023-05-07 DIAGNOSIS — I10 Essential (primary) hypertension: Secondary | ICD-10-CM | POA: Diagnosis not present

## 2023-05-07 DIAGNOSIS — J019 Acute sinusitis, unspecified: Secondary | ICD-10-CM

## 2023-05-07 DIAGNOSIS — J0101 Acute recurrent maxillary sinusitis: Secondary | ICD-10-CM | POA: Diagnosis not present

## 2023-05-07 DIAGNOSIS — E119 Type 2 diabetes mellitus without complications: Secondary | ICD-10-CM

## 2023-05-07 DIAGNOSIS — Z6839 Body mass index (BMI) 39.0-39.9, adult: Secondary | ICD-10-CM

## 2023-05-07 DIAGNOSIS — M5441 Lumbago with sciatica, right side: Secondary | ICD-10-CM | POA: Diagnosis not present

## 2023-05-07 DIAGNOSIS — Z8249 Family history of ischemic heart disease and other diseases of the circulatory system: Secondary | ICD-10-CM | POA: Diagnosis not present

## 2023-05-07 HISTORY — DX: Acute sinusitis, unspecified: J01.90

## 2023-05-07 LAB — COMPREHENSIVE METABOLIC PANEL
ALT: 32 U/L (ref 0–35)
AST: 38 U/L — ABNORMAL HIGH (ref 0–37)
Albumin: 4.2 g/dL (ref 3.5–5.2)
Alkaline Phosphatase: 93 U/L (ref 39–117)
BUN: 7 mg/dL (ref 6–23)
CO2: 30 meq/L (ref 19–32)
Calcium: 9.3 mg/dL (ref 8.4–10.5)
Chloride: 106 meq/L (ref 96–112)
Creatinine, Ser: 0.8 mg/dL (ref 0.40–1.20)
GFR: 78.15 mL/min (ref >60.00)
Glucose, Bld: 107 mg/dL — ABNORMAL HIGH (ref 70–99)
Potassium: 4 meq/L (ref 3.5–5.1)
Sodium: 143 meq/L (ref 135–145)
Total Bilirubin: 0.8 mg/dL (ref 0.2–1.2)
Total Protein: 6.6 g/dL (ref 6.0–8.3)

## 2023-05-07 LAB — HEMOGLOBIN A1C: Hgb A1c MFr Bld: 5.6 % (ref 4.6–6.5)

## 2023-05-07 MED ORDER — SEMAGLUTIDE (2 MG/DOSE) 8 MG/3ML ~~LOC~~ SOPN: 2.0000 mg | PEN_INJECTOR | SUBCUTANEOUS | 3 refills | Status: DC

## 2023-05-07 MED ORDER — CEFDINIR 300 MG PO CAPS: 300.0000 mg | ORAL_CAPSULE | Freq: Two times a day (BID) | ORAL | 0 refills | Status: DC

## 2023-05-07 MED ORDER — FUROSEMIDE 40 MG PO TABS: 40.0000 mg | ORAL_TABLET | Freq: Every day | ORAL | 5 refills | Status: DC | PRN

## 2023-05-07 NOTE — Assessment & Plan Note (Signed)
Increase Ozempic to 2 mg/wk ?

## 2023-05-07 NOTE — Assessment & Plan Note (Signed)
L side Omnicef po bid

## 2023-05-07 NOTE — Progress Notes (Signed)
Subjective:  Patient ID: Kristy Keller, female    DOB: 05/20/1961  Age: 62 y.o. MRN: [id number]  CC: Medical Management of Chronic Issues (3 month follow up )   HPI Kristy Keller presents for DM, LBP,  C/o sinus pain - L eye, nose x weeks Coughing up brown stuff  Outpatient Medications Prior to Visit  Medication Sig Dispense Refill   acetaminophen (TYLENOL) 500 MG tablet Take 500 mg by mouth every 5 (five) hours as needed for headache.     aspirin EC 81 MG tablet Take 1 tablet (81 mg total) by mouth daily. Swallow whole. 90 tablet 3   Cholecalciferol (VITAMIN D3) 2000 units capsule Take 1 capsule (2,000 Units total) by mouth daily. 100 capsule 3   gabapentin (NEURONTIN) 300 MG capsule Take 300 mg by mouth at bedtime.     metoprolol succinate (TOPROL-XL) 25 MG 24 hr tablet Take 1 tablet (25 mg total) by mouth at bedtime. 90 tablet 3   nitroGLYCERIN (NITROSTAT) 0.4 MG SL tablet Place 0.4 mg under the tongue every 5 (five) minutes as needed for chest pain.     furosemide (LASIX) 40 MG tablet TAKE 1 TABLET (40 MG TOTAL) BY MOUTH 2 TIMES DAILY. **PT NEEDS APPT FOR REFILL 30 tablet 0   Semaglutide, 1 MG/DOSE, 4 MG/3ML SOPN Inject 1 mg as directed once a week. 3 mL 3   zinc sulfate 220 (50 Zn) MG capsule Take 220 mg by mouth daily.     No facility-administered medications prior to visit.    ROS: Review of Systems  Constitutional:  Positive for fatigue. Negative for activity change, appetite change, chills and unexpected weight change.  HENT:  Positive for congestion. Negative for mouth sores and sinus pressure.   Eyes:  Negative for visual disturbance.  Respiratory:  Positive for cough. Negative for chest tightness.   Gastrointestinal:  Negative for abdominal pain and nausea.  Genitourinary:  Negative for difficulty urinating, frequency and vaginal pain.  Musculoskeletal:  Negative for back pain and gait problem.  Skin:  Negative for pallor and rash.  Neurological:  Positive  for headaches. Negative for dizziness, tremors, weakness and numbness.  Psychiatric/Behavioral:  Negative for confusion and sleep disturbance.     Objective:  BP 130/76 (BP Location: Left Arm, Patient Position: Sitting, Cuff Size: Normal)   Pulse 66   Temp 98 F (36.7 C) (Oral)   Ht 5\' 5"  (1.651 m)   Wt 235 lb (106.6 kg)   SpO2 98%   BMI 39.11 kg/m   BP Readings from Last 3 Encounters:  05/07/23 130/76  03/29/23 130/69  02/21/23 118/73    Wt Readings from Last 3 Encounters:  05/07/23 235 lb (106.6 kg)  03/29/23 234 lb (106.1 kg)  02/21/23 233 lb (105.7 kg)    Physical Exam Constitutional:      General: She is not in acute distress.    Appearance: She is well-developed. She is obese.  HENT:     Head: Normocephalic.     Right Ear: External ear normal.     Left Ear: External ear normal.     Nose: Nose normal.  Eyes:     General:        Right eye: No discharge.        Left eye: No discharge.     Conjunctiva/sclera: Conjunctivae normal.     Pupils: Pupils are equal, round, and reactive to light.  Neck:     Thyroid: No thyromegaly.  Vascular: No JVD.     Trachea: No tracheal deviation.  Cardiovascular:     Rate and Rhythm: Normal rate and regular rhythm.     Heart sounds: Normal heart sounds.  Pulmonary:     Effort: No respiratory distress.     Breath sounds: No stridor. No wheezing.  Abdominal:     General: Bowel sounds are normal. There is no distension.     Palpations: Abdomen is soft. There is no mass.     Tenderness: There is no abdominal tenderness. There is no guarding or rebound.  Musculoskeletal:        General: No tenderness.     Cervical back: Normal range of motion and neck supple. No rigidity.  Lymphadenopathy:     Cervical: No cervical adenopathy.  Skin:    Findings: No erythema or rash.  Neurological:     Cranial Nerves: No cranial nerve deficit.     Motor: No abnormal muscle tone.     Coordination: Coordination normal.     Deep Tendon  Reflexes: Reflexes normal.  Psychiatric:        Behavior: Behavior normal.        Thought Content: Thought content normal.        Judgment: Judgment normal.   Knees, hips w/pain  Lab Results  Component Value Date   WBC 4.3 11/26/2022   HGB 14.0 11/26/2022   HCT 42.6 11/26/2022   PLT 224 11/26/2022   GLUCOSE 148 (H) 11/26/2022   CHOL 152 11/26/2022   TRIG 167 (H) 11/26/2022   HDL 39 (L) 11/26/2022   LDLCALC 84 11/26/2022   ALT 50 (H) 11/26/2022   AST 71 (H) 11/26/2022   NA 139 11/26/2022   K 4.1 11/26/2022   CL 100 11/26/2022   CREATININE 1.01 (H) 11/26/2022   BUN 13 11/26/2022   CO2 27 11/26/2022   TSH 0.827 11/26/2022   INR 1.1 (H) 03/07/2017   HGBA1C 6.8 (H) 11/26/2022    MM 3D SCREENING MAMMOGRAM BILATERAL BREAST  Result Date: 03/21/2023 CLINICAL DATA:  Screening. EXAM: DIGITAL SCREENING BILATERAL MAMMOGRAM WITH TOMOSYNTHESIS AND CAD TECHNIQUE: Bilateral screening digital craniocaudal and mediolateral oblique mammograms were obtained. Bilateral screening digital breast tomosynthesis was performed. The images were evaluated with computer-aided detection. COMPARISON:  Previous exam(s). ACR Breast Density Category a: The breasts are almost entirely fatty. FINDINGS: There are no findings suspicious for malignancy. IMPRESSION: No mammographic evidence of malignancy. A result letter of this screening mammogram will be mailed directly to the patient. RECOMMENDATION: Screening mammogram in one year. (Code:SM-B-01Y) BI-RADS CATEGORY  1: Negative. Electronically Signed   By: Frederico Hamman M.D.   On: 03/21/2023 15:56    Assessment & Plan:   Problem List Items Addressed This Visit     Family history of early CAD    On ASA      Benign essential hypertension     BP Readings from Last 3 Encounters:  05/07/23 130/76  03/29/23 130/69  02/21/23 118/73         Relevant Medications   furosemide (LASIX) 40 MG tablet   Class 2 severe obesity due to excess calories with  serious comorbidity in adult [facility]) - Primary    Wt Readings from Last 3 Encounters:  05/07/23 235 lb (106.6 kg)  03/29/23 234 lb (106.1 kg)  02/21/23 233 lb (105.7 kg)   On Ozempic  Consider Phentermine       Relevant Medications   Semaglutide, 2 MG/DOSE, 8 MG/3ML SOPN   Low  back pain    Better      Diabetes mellitus without complication ([name])    Increase Ozempic to 2 mg/wk      Relevant Medications   Semaglutide, 2 MG/DOSE, 8 MG/3ML SOPN   Acute sinusitis    L side Omnicef po bid      Relevant Medications   cefdinir (OMNICEF) 300 MG capsule      Meds ordered this encounter  Medications   furosemide (LASIX) 40 MG tablet    Sig: Take 1 tablet (40 mg total) by mouth daily as needed.    Dispense:  30 tablet    Refill:  5   Semaglutide, 2 MG/DOSE, 8 MG/3ML SOPN    Sig: Inject 2 mg as directed once a week.    Dispense:  3 mL    Refill:  3   cefdinir (OMNICEF) 300 MG capsule    Sig: Take 1 capsule (300 mg total) by mouth 2 (two) times daily.    Dispense:  28 capsule    Refill:  0      Follow-up: No follow-ups on file.  Sonda Primes, MD

## 2023-05-07 NOTE — Assessment & Plan Note (Addendum)
Wt Readings from Last 3 Encounters:  05/07/23 235 lb (106.6 kg)  03/29/23 234 lb (106.1 kg)  02/21/23 233 lb (105.7 kg)   On Ozempic  Consider Phentermine

## 2023-05-07 NOTE — Assessment & Plan Note (Addendum)
Better  

## 2023-05-07 NOTE — Assessment & Plan Note (Signed)
On ASA 

## 2023-05-07 NOTE — Assessment & Plan Note (Signed)
  BP Readings from Last 3 Encounters:  05/07/23 130/76  03/29/23 130/69  02/21/23 118/73

## 2023-06-06 DIAGNOSIS — G8929 Other chronic pain: Secondary | ICD-10-CM | POA: Diagnosis not present

## 2023-06-06 DIAGNOSIS — M25561 Pain in right knee: Secondary | ICD-10-CM | POA: Diagnosis not present

## 2023-06-06 DIAGNOSIS — M25562 Pain in left knee: Secondary | ICD-10-CM | POA: Diagnosis not present

## 2023-06-11 ENCOUNTER — Other Ambulatory Visit: Payer: Self-pay

## 2023-08-15 ENCOUNTER — Other Ambulatory Visit: Payer: Self-pay | Admitting: Obstetrics and Gynecology

## 2023-08-15 DIAGNOSIS — Z1231 Encounter for screening mammogram for malignant neoplasm of breast: Secondary | ICD-10-CM

## 2023-09-02 ENCOUNTER — Telehealth: Payer: Self-pay | Admitting: Internal Medicine

## 2023-09-02 NOTE — Telephone Encounter (Unsigned)
 Copied from CRM [id number]. Topic: Clinical - Medication Question >> Sep 02, 2023 10:49 AM Alcus Dad wrote: Reason for CRM: Patient stated that she having constant sinus headaches, has sinus drainage and now is sick to her stomach. She is scared to eat. Patient wants Dr. To send her something for the symptoms

## 2023-09-03 NOTE — Telephone Encounter (Signed)
 Patient went to urgent care and found out she has covid she doesn't need any meds urgent care gave her some meds for covid she also stated she is not on ozempic anymore and wants to know if she need's to still come in for the appt.

## 2023-09-05 ENCOUNTER — Ambulatory Visit: Payer: Medicare HMO | Admitting: Internal Medicine

## 2023-09-05 NOTE — Telephone Encounter (Signed)
 Yes, please come for the appointment.  Thanks

## 2023-09-05 NOTE — Telephone Encounter (Signed)
 Called and informed patient to keep follow up appointment for end of month. They expressed understanding

## 2023-09-17 DIAGNOSIS — B354 Tinea corporis: Secondary | ICD-10-CM | POA: Insufficient documentation

## 2023-09-17 HISTORY — DX: Tinea corporis: B35.4

## 2023-09-26 DIAGNOSIS — S80812A Abrasion, left lower leg, initial encounter: Secondary | ICD-10-CM | POA: Diagnosis not present

## 2023-09-26 DIAGNOSIS — H6503 Acute serous otitis media, bilateral: Secondary | ICD-10-CM | POA: Diagnosis not present

## 2023-09-26 DIAGNOSIS — R051 Acute cough: Secondary | ICD-10-CM | POA: Diagnosis not present

## 2023-09-26 DIAGNOSIS — W5503XA Scratched by cat, initial encounter: Secondary | ICD-10-CM | POA: Diagnosis not present

## 2023-09-26 DIAGNOSIS — J208 Acute bronchitis due to other specified organisms: Secondary | ICD-10-CM | POA: Diagnosis not present

## 2023-10-01 ENCOUNTER — Ambulatory Visit: Admitting: Internal Medicine

## 2023-10-01 ENCOUNTER — Encounter: Payer: Self-pay | Admitting: Internal Medicine

## 2023-10-01 VITALS — BP 132/86 | HR 76 | Temp 98.4°F | Ht 65.0 in | Wt 243.4 lb

## 2023-10-01 DIAGNOSIS — U071 COVID-19: Secondary | ICD-10-CM

## 2023-10-01 DIAGNOSIS — M5442 Lumbago with sciatica, left side: Secondary | ICD-10-CM

## 2023-10-01 DIAGNOSIS — E119 Type 2 diabetes mellitus without complications: Secondary | ICD-10-CM | POA: Diagnosis not present

## 2023-10-01 DIAGNOSIS — E118 Type 2 diabetes mellitus with unspecified complications: Secondary | ICD-10-CM | POA: Diagnosis not present

## 2023-10-01 DIAGNOSIS — J0101 Acute recurrent maxillary sinusitis: Secondary | ICD-10-CM | POA: Diagnosis not present

## 2023-10-01 DIAGNOSIS — M5441 Lumbago with sciatica, right side: Secondary | ICD-10-CM | POA: Diagnosis not present

## 2023-10-01 DIAGNOSIS — G8929 Other chronic pain: Secondary | ICD-10-CM | POA: Diagnosis not present

## 2023-10-01 DIAGNOSIS — Z6839 Body mass index (BMI) 39.0-39.9, adult: Secondary | ICD-10-CM | POA: Diagnosis not present

## 2023-10-01 DIAGNOSIS — E66812 Obesity, class 2: Secondary | ICD-10-CM

## 2023-10-01 DIAGNOSIS — R635 Abnormal weight gain: Secondary | ICD-10-CM

## 2023-10-01 HISTORY — DX: COVID-19: U07.1

## 2023-10-01 MED ORDER — PHENTERMINE HCL 37.5 MG PO TABS: 37.5000 mg | ORAL_TABLET | Freq: Every day | ORAL | 2 refills | Status: DC

## 2023-10-01 MED ORDER — CEFDINIR 300 MG PO CAPS: 300.0000 mg | ORAL_CAPSULE | Freq: Two times a day (BID) | ORAL | 0 refills | Status: DC

## 2023-10-01 MED ORDER — PROMETHAZINE-DM 6.25-15 MG/5ML PO SYRP
5.0000 mL | ORAL_SOLUTION | Freq: Four times a day (QID) | ORAL | 0 refills | Status: DC | PRN
Start: 2023-10-01 — End: 2024-03-09

## 2023-10-01 NOTE — Assessment & Plan Note (Signed)
Recovering  

## 2023-10-01 NOTE — Assessment & Plan Note (Signed)
 Start Phentermine   Potential benefits of a long term amphetamines  use as well as potential risks  and complications were explained to the patient and were aknowledged.

## 2023-10-01 NOTE — Progress Notes (Signed)
 Subjective:  Patient ID: Kristy Keller, female    DOB: 1961/05/22  Age: 63 y.o. MRN: [id number]  CC: Medical Management of Chronic Issues (3 Month Follow Up. Patient notes they had covid at the beginning of this month. All symptoms resolved besides dry cough and fullness of left ear. Has since visited urgent care for these remaining symptoms, prescribed prednisone, azithromycin, and Flonase. Has finished these )   HPI Kristy Keller presents for 3 Month Follow Up. Patient notes they had COVID at the beginning of this month. All symptoms resolved besides dry cough and fullness of left ear. Has since visited urgent care for these remaining symptoms, prescribed prednisone, azithromycin, and Flonase. Has finished Zpac yesterday  Outpatient Medications Prior to Visit  Medication Sig Dispense Refill   acetaminophen  (TYLENOL ) 500 MG tablet Take 500 mg by mouth every 5 (five) hours as needed for headache.     aspirin  EC 81 MG tablet Take 1 tablet (81 mg total) by mouth daily. Swallow whole. 90 tablet 3   Cholecalciferol  (VITAMIN D3) 2000 units capsule Take 1 capsule (2,000 Units total) by mouth daily. 100 capsule 3   metoprolol  succinate (TOPROL -XL) 25 MG 24 hr tablet Take 1 tablet (25 mg total) by mouth at bedtime. 90 tablet 3   nitroGLYCERIN  (NITROSTAT ) 0.4 MG SL tablet Place 0.4 mg under the tongue every 5 (five) minutes as needed for chest pain. (Patient not taking: Reported on 10/01/2023)     cefdinir  (OMNICEF ) 300 MG capsule Take 1 capsule (300 mg total) by mouth 2 (two) times daily. (Patient not taking: Reported on 10/01/2023) 28 capsule 0   furosemide  (LASIX ) 40 MG tablet Take 1 tablet (40 mg total) by mouth daily as needed. (Patient not taking: Reported on 10/01/2023) 30 tablet 5   gabapentin  (NEURONTIN ) 300 MG capsule Take 300 mg by mouth at bedtime. (Patient not taking: Reported on 10/01/2023)     No facility-administered medications prior to visit.    ROS: Review of Systems   Constitutional:  Positive for fatigue. Negative for activity change, appetite change, chills and unexpected weight change.  HENT:  Positive for congestion, postnasal drip and rhinorrhea. Negative for mouth sores and sinus pressure.   Eyes:  Negative for visual disturbance.  Respiratory:  Positive for cough. Negative for chest tightness.   Gastrointestinal:  Negative for abdominal pain and nausea.  Genitourinary:  Negative for difficulty urinating, frequency and vaginal pain.  Musculoskeletal:  Negative for back pain and gait problem.  Skin:  Negative for pallor and rash.  Neurological:  Negative for dizziness, tremors, weakness, numbness and headaches.  Psychiatric/Behavioral:  Negative for confusion, sleep disturbance and suicidal ideas.     Objective:  BP 132/86   Pulse 76   Temp 98.4 F (36.9 C)   Ht 5\' 5"  (1.651 m)   Wt 243 lb 6.4 oz (110.4 kg)   SpO2 99%   BMI 40.50 kg/m   BP Readings from Last 3 Encounters:  10/01/23 132/86  05/07/23 130/76  03/29/23 130/69    Wt Readings from Last 3 Encounters:  10/01/23 243 lb 6.4 oz (110.4 kg)  05/07/23 235 lb (106.6 kg)  03/29/23 234 lb (106.1 kg)    Physical Exam Constitutional:      General: She is not in acute distress.    Appearance: She is well-developed. She is obese.  HENT:     Head: Normocephalic.     Right Ear: External ear normal.     Left Ear: External ear  normal.     Nose: Nose normal.  Eyes:     General:        Right eye: No discharge.        Left eye: No discharge.     Conjunctiva/sclera: Conjunctivae normal.     Pupils: Pupils are equal, round, and reactive to light.  Neck:     Thyroid : No thyromegaly.     Vascular: No JVD.     Trachea: No tracheal deviation.  Cardiovascular:     Rate and Rhythm: Normal rate and regular rhythm.     Heart sounds: Normal heart sounds.  Pulmonary:     Effort: No respiratory distress.     Breath sounds: No stridor. No wheezing.  Abdominal:     General: Bowel sounds  are normal. There is no distension.     Palpations: Abdomen is soft. There is no mass.     Tenderness: There is no abdominal tenderness. There is no guarding or rebound.  Musculoskeletal:        General: Swelling present. No tenderness.     Cervical back: Normal range of motion and neck supple. No rigidity.     Right lower leg: Edema present.     Left lower leg: Edema present.  Lymphadenopathy:     Cervical: No cervical adenopathy.  Skin:    Findings: No erythema or rash.  Neurological:     Cranial Nerves: No cranial nerve deficit.     Motor: No abnormal muscle tone.     Coordination: Coordination normal.     Deep Tendon Reflexes: Reflexes normal.  Psychiatric:        Behavior: Behavior normal.        Thought Content: Thought content normal.        Judgment: Judgment normal.   L TM - red Both ankles with edema and some hyperpigmentation   Lab Results  Component Value Date   WBC 4.3 11/26/2022   HGB 14.0 11/26/2022   HCT 42.6 11/26/2022   PLT 224 11/26/2022   GLUCOSE 107 (H) 05/07/2023   CHOL 152 11/26/2022   TRIG 167 (H) 11/26/2022   HDL 39 (L) 11/26/2022   LDLCALC 84 11/26/2022   ALT 32 05/07/2023   AST 38 (H) 05/07/2023   NA 143 05/07/2023   K 4.0 05/07/2023   CL 106 05/07/2023   CREATININE 0.80 05/07/2023   BUN 7 05/07/2023   CO2 30 05/07/2023   TSH 0.827 11/26/2022   INR 1.1 (H) 03/07/2017   HGBA1C 5.6 05/07/2023    MM 3D SCREENING MAMMOGRAM BILATERAL BREAST Result Date: 03/21/2023 CLINICAL DATA:  Screening. EXAM: DIGITAL SCREENING BILATERAL MAMMOGRAM WITH TOMOSYNTHESIS AND CAD TECHNIQUE: Bilateral screening digital craniocaudal and mediolateral oblique mammograms were obtained. Bilateral screening digital breast tomosynthesis was performed. The images were evaluated with computer-aided detection. COMPARISON:  Previous exam(s). ACR Breast Density Category a: The breasts are almost entirely fatty. FINDINGS: There are no findings suspicious for malignancy.  IMPRESSION: No mammographic evidence of malignancy. A result letter of this screening mammogram will be mailed directly to the patient. RECOMMENDATION: Screening mammogram in one year. (Code:SM-B-01Y) BI-RADS CATEGORY  1: Negative. Electronically Signed   By: Alinda Apley M.D.   On: 03/21/2023 15:56    Assessment & Plan:   Problem List Items Addressed This Visit     Class 2 severe obesity due to excess calories with serious comorbidity in adult [facility])   Start Phentermine    Potential benefits of a long term amphetamines  use as  well as potential risks  and complications were explained to the patient and were aknowledged.       Relevant Medications   phentermine  (ADIPEX-P ) 37.5 MG tablet   Low back pain   Doing well      Weight gain - Primary   Worse.  Discussed intermittent fasting diet Phentermine  trial  Potential benefits of a long term phentermine  use as well as potential risks  and complications were explained to the patient and were aknowledged.       Relevant Orders   TSH   Diabetes mellitus type 2 with complications ([name])   Check A1c Off Ozempic       Diabetes mellitus without complication ([name])   Relevant Orders   Comprehensive metabolic panel with GFR   TSH   Hemoglobin A1c   Acute sinusitis   Prescribed cefdinir  and Promethazine  DM cough syrup      Relevant Medications   promethazine -dextromethorphan (PROMETHAZINE -DM) 6.25-15 MG/5ML syrup   cefdinir  (OMNICEF ) 300 MG capsule   COVID-19   Recovering      Relevant Medications   cefdinir  (OMNICEF ) 300 MG capsule      Meds ordered this encounter  Medications   promethazine -dextromethorphan (PROMETHAZINE -DM) 6.25-15 MG/5ML syrup    Sig: Take 5 mLs by mouth 4 (four) times daily as needed for cough.    Dispense:  240 mL    Refill:  0   phentermine  (ADIPEX-P ) 37.5 MG tablet    Sig: Take 1 tablet (37.5 mg total) by mouth daily before breakfast.    Dispense:  30 tablet    Refill:  2   cefdinir   (OMNICEF ) 300 MG capsule    Sig: Take 1 capsule (300 mg total) by mouth 2 (two) times daily.    Dispense:  20 capsule    Refill:  0      Follow-up: Return in about 3 months (around 12/31/2023) for a follow-up visit.  Anitra Barn, MD

## 2023-10-01 NOTE — Assessment & Plan Note (Signed)
 Doing well

## 2023-10-01 NOTE — Assessment & Plan Note (Signed)
 Check A1c Off Ozempic 

## 2023-10-02 ENCOUNTER — Telehealth: Payer: Self-pay | Admitting: Internal Medicine

## 2023-10-02 NOTE — Telephone Encounter (Signed)
 Okay not to take phentermine if she does not want to. [employer] does not cover weight loss drugs as far as I know [facility] is for diabetics only-Lynn does not have diabetes Thanks

## 2023-10-02 NOTE — Telephone Encounter (Signed)
 Copied from CRM [id number]. Topic: Clinical - Medical Advice >> Oct 02, 2023  8:57 AM Albertha Alosa wrote: Reason for CRM: Patient called in regarding phentermine (ADIPEX-P) 37.5 MG tablet , stated she has SVT and not sure she should take it, Stated she heard of [facility] wanted to know if Dr.Plotnikov would be able to write an prescription for that. Would like a callback regarding this.

## 2023-10-04 NOTE — Telephone Encounter (Signed)
 Attempted to reach pt... Provider has stated the following "Okay not to take phentermine  if she does not want to. [employer] Medicare does not cover weight loss drugs as far as I know [employer] is for diabetics only-Kristy Keller does not have diabetes Thanks"

## 2023-10-08 NOTE — Assessment & Plan Note (Signed)
 Prescribed cefdinir  and Promethazine  DM cough syrup

## 2023-10-08 NOTE — Assessment & Plan Note (Signed)
 Worse.  Discussed intermittent fasting diet Phentermine  trial  Potential benefits of a long term phentermine  use as well as potential risks  and complications were explained to the patient and were aknowledged.

## 2023-11-14 DIAGNOSIS — H04123 Dry eye syndrome of bilateral lacrimal glands: Secondary | ICD-10-CM

## 2023-11-14 DIAGNOSIS — H18523 Epithelial (juvenile) corneal dystrophy, bilateral: Secondary | ICD-10-CM | POA: Diagnosis not present

## 2023-11-14 DIAGNOSIS — H40013 Open angle with borderline findings, low risk, bilateral: Secondary | ICD-10-CM | POA: Diagnosis not present

## 2023-11-14 DIAGNOSIS — Z961 Presence of intraocular lens: Secondary | ICD-10-CM | POA: Diagnosis not present

## 2023-11-14 HISTORY — DX: Dry eye syndrome of bilateral lacrimal glands: H04.123

## 2023-11-23 ENCOUNTER — Other Ambulatory Visit: Payer: Self-pay | Admitting: Internal Medicine

## 2023-12-18 ENCOUNTER — Ambulatory Visit: Admitting: Internal Medicine

## 2023-12-18 ENCOUNTER — Encounter: Payer: Self-pay | Admitting: Internal Medicine

## 2023-12-18 ENCOUNTER — Other Ambulatory Visit

## 2023-12-18 VITALS — BP 130/80 | HR 79 | Temp 98.2°F | Ht 65.0 in | Wt 246.0 lb

## 2023-12-18 DIAGNOSIS — E119 Type 2 diabetes mellitus without complications: Secondary | ICD-10-CM

## 2023-12-18 DIAGNOSIS — R609 Edema, unspecified: Secondary | ICD-10-CM

## 2023-12-18 DIAGNOSIS — Z6839 Body mass index (BMI) 39.0-39.9, adult: Secondary | ICD-10-CM | POA: Diagnosis not present

## 2023-12-18 DIAGNOSIS — M5441 Lumbago with sciatica, right side: Secondary | ICD-10-CM

## 2023-12-18 DIAGNOSIS — G8929 Other chronic pain: Secondary | ICD-10-CM | POA: Diagnosis not present

## 2023-12-18 DIAGNOSIS — E66812 Obesity, class 2: Secondary | ICD-10-CM

## 2023-12-18 DIAGNOSIS — M5442 Lumbago with sciatica, left side: Secondary | ICD-10-CM | POA: Diagnosis not present

## 2023-12-18 DIAGNOSIS — Z7985 Long-term (current) use of injectable non-insulin antidiabetic drugs: Secondary | ICD-10-CM

## 2023-12-18 DIAGNOSIS — R635 Abnormal weight gain: Secondary | ICD-10-CM | POA: Diagnosis not present

## 2023-12-18 LAB — COMPREHENSIVE METABOLIC PANEL WITH GFR
ALT: 46 U/L — ABNORMAL HIGH (ref 0–35)
AST: 52 U/L — ABNORMAL HIGH (ref 0–37)
Albumin: 4.4 g/dL (ref 3.5–5.2)
Alkaline Phosphatase: 94 U/L (ref 39–117)
BUN: 10 mg/dL (ref 6–23)
CO2: 29 meq/L (ref 19–32)
Calcium: 9.5 mg/dL (ref 8.4–10.5)
Chloride: 104 meq/L (ref 96–112)
Creatinine, Ser: 0.84 mg/dL (ref 0.40–1.20)
GFR: 73.39 mL/min
Glucose, Bld: 144 mg/dL — ABNORMAL HIGH (ref 70–99)
Potassium: 4.2 meq/L (ref 3.5–5.1)
Sodium: 142 meq/L (ref 135–145)
Total Bilirubin: 1.2 mg/dL (ref 0.2–1.2)
Total Protein: 6.8 g/dL (ref 6.0–8.3)

## 2023-12-18 LAB — TSH: TSH: 0.94 u[IU]/mL (ref 0.35–5.50)

## 2023-12-18 LAB — HEMOGLOBIN A1C: Hgb A1c MFr Bld: 7.3 % — ABNORMAL HIGH (ref 4.6–6.5)

## 2023-12-18 MED ORDER — TIRZEPATIDE 2.5 MG/0.5ML ~~LOC~~ SOAJ: 2.5000 mg | SUBCUTANEOUS | 2 refills | Status: DC

## 2023-12-18 NOTE — Assessment & Plan Note (Signed)
Resolved off BP med

## 2023-12-18 NOTE — Progress Notes (Signed)
 Subjective:  Patient ID: Kristy Keller, female    DOB: [birth year]  Age: 63 y.o. MRN: [id number]  CC: Medical Management of Chronic Issues (3 mnth f/u )   HPI Kristy Keller presents for obesity - she did not take Phentermine  F/u on DM - Kristy Keller was on Ozempic  (she did not loose much wt)  Outpatient Medications Prior to Visit  Medication Sig Dispense Refill   acetaminophen  (TYLENOL ) 500 MG tablet Take 500 mg by mouth every 5 (five) hours as needed for headache.     aspirin  EC 81 MG tablet Take 1 tablet (81 mg total) by mouth daily. Swallow whole. 90 tablet 3   cefdinir  (OMNICEF ) 300 MG capsule Take 1 capsule (300 mg total) by mouth 2 (two) times daily. 20 capsule 0   Cholecalciferol  (VITAMIN D3) 2000 units capsule Take 1 capsule (2,000 Units total) by mouth daily. 100 capsule 3   metoprolol  succinate (TOPROL -XL) 25 MG 24 hr tablet TAKE 1 TABLET BY MOUTH EVERYDAY AT BEDTIME 90 tablet 3   metoprolol  tartrate (LOPRESSOR ) 50 MG tablet Take 50 mg by mouth.     phentermine  (ADIPEX-P ) 37.5 MG tablet Take 1 tablet (37.5 mg total) by mouth daily before breakfast. 30 tablet 2   promethazine -dextromethorphan (PROMETHAZINE -DM) 6.25-15 MG/5ML syrup Take 5 mLs by mouth 4 (four) times daily as needed for cough. 240 mL 0   nitroGLYCERIN  (NITROSTAT ) 0.4 MG SL tablet Place 0.4 mg under the tongue every 5 (five) minutes as needed for chest pain. (Patient not taking: Reported on 12/18/2023)     No facility-administered medications prior to visit.    ROS: Review of Systems  Constitutional:  Positive for fatigue and unexpected weight change. Negative for activity change, appetite change and chills.  HENT:  Negative for congestion, mouth sores and sinus pressure.   Eyes:  Negative for visual disturbance.  Respiratory:  Negative for cough and chest tightness.   Gastrointestinal:  Negative for abdominal pain and nausea.  Genitourinary:  Negative for difficulty urinating, frequency and vaginal pain.   Musculoskeletal:  Positive for arthralgias and gait problem. Negative for back pain.  Skin:  Negative for pallor and rash.  Neurological:  Negative for dizziness, tremors, weakness, numbness and headaches.  Psychiatric/Behavioral:  Negative for confusion, decreased concentration, sleep disturbance and suicidal ideas.     Objective:  BP 130/80   Pulse 79   Temp 98.2 F (36.8 C) (Oral)   Ht 5' 5 (1.651 m)   Wt 246 lb (111.6 kg)   SpO2 98%   BMI 40.94 kg/m   BP Readings from Last 3 Encounters:  12/18/23 130/80  10/01/23 132/86  05/07/23 130/76    Wt Readings from Last 3 Encounters:  12/18/23 246 lb (111.6 kg)  10/01/23 243 lb 6.4 oz (110.4 kg)  05/07/23 235 lb (106.6 kg)    Physical Exam Constitutional:      General: She is not in acute distress.    Appearance: She is well-developed.  HENT:     Head: Normocephalic.     Right Ear: External ear normal.     Left Ear: External ear normal.     Nose: Nose normal.  Eyes:     General:        Right eye: No discharge.        Left eye: No discharge.     Conjunctiva/sclera: Conjunctivae normal.     Pupils: Pupils are equal, round, and reactive to light.  Neck:     Thyroid : No thyromegaly.  Vascular: No JVD.     Trachea: No tracheal deviation.  Cardiovascular:     Rate and Rhythm: Normal rate and regular rhythm.     Heart sounds: Normal heart sounds.  Pulmonary:     Effort: No respiratory distress.     Breath sounds: No stridor. No wheezing.  Abdominal:     General: Bowel sounds are normal. There is no distension.     Palpations: Abdomen is soft. There is no mass.     Tenderness: There is no abdominal tenderness. There is no guarding or rebound.  Musculoskeletal:        General: No tenderness.     Cervical back: Normal range of motion and neck supple. No rigidity.  Lymphadenopathy:     Cervical: No cervical adenopathy.  Skin:    Findings: No erythema or rash.  Neurological:     Cranial Nerves: No cranial nerve  deficit.     Motor: No abnormal muscle tone.     Coordination: Coordination normal.     Deep Tendon Reflexes: Reflexes normal.  Psychiatric:        Behavior: Behavior normal.        Thought Content: Thought content normal.        Judgment: Judgment normal.   Swollen distal shins and ankles with mild erythema Antalgic gait Obese  Lab Results  Component Value Date   WBC 4.3 11/26/2022   HGB 14.0 11/26/2022   HCT 42.6 11/26/2022   PLT 224 11/26/2022   GLUCOSE 144 (H) 12/18/2023   CHOL 152 11/26/2022   TRIG 167 (H) 11/26/2022   HDL 39 (L) 11/26/2022   LDLCALC 84 11/26/2022   ALT 46 (H) 12/18/2023   AST 52 (H) 12/18/2023   NA 142 12/18/2023   K 4.2 12/18/2023   CL 104 12/18/2023   CREATININE 0.84 12/18/2023   BUN 10 12/18/2023   CO2 29 12/18/2023   TSH 0.94 12/18/2023   INR 1.1 (H) 03/07/2017   HGBA1C 7.3 (H) 12/18/2023    MM 3D SCREENING MAMMOGRAM BILATERAL BREAST Result Date: 03/21/2023 CLINICAL DATA:  Screening. EXAM: DIGITAL SCREENING BILATERAL MAMMOGRAM WITH TOMOSYNTHESIS AND CAD TECHNIQUE: Bilateral screening digital craniocaudal and mediolateral oblique mammograms were obtained. Bilateral screening digital breast tomosynthesis was performed. The images were evaluated with computer-aided detection. COMPARISON:  Previous exam(s). ACR Breast Density Category a: The breasts are almost entirely fatty. FINDINGS: There are no findings suspicious for malignancy. IMPRESSION: No mammographic evidence of malignancy. A result letter of this screening mammogram will be mailed directly to the patient. RECOMMENDATION: Screening mammogram in one year. (Code:SM-B-01Y) BI-RADS CATEGORY  1: Negative. Electronically Signed   By: Kristy Keller M.D.   On: 03/21/2023 15:56    Assessment & Plan:   Problem List Items Addressed This Visit     Edema   Resolved off BP med      Class 2 severe obesity due to excess calories with serious comorbidity in adult [name])   Kristy Keller was on Ozempic  (she  did not loose much wt). Tirzepetide prescribed today Check A1c      Relevant Medications   tirzepatide  (MOUNJARO ) 2.5 MG/0.5ML Pen   Low back pain   Doing well      Diabetes mellitus without complication ([name]) - Primary   Kristy Keller was on Ozempic  (she did not loose much wt) Start Mounjaro  Check A1c      Relevant Medications   tirzepatide  (MOUNJARO ) 2.5 MG/0.5ML Pen      Meds ordered this encounter  Medications  tirzepatide  (MOUNJARO ) 2.5 MG/0.5ML Pen    Sig: Inject 2.5 mg into the skin once a week.    Dispense:  2 mL    Refill:  2      Follow-up: Return in about 3 months (around 03/19/2024) for a follow-up visit.  Marolyn Noel, MD

## 2023-12-18 NOTE — Assessment & Plan Note (Signed)
 Kristy Keller was on Ozempic  (she did not loose much wt) Start Mounjaro  Check A1c

## 2023-12-18 NOTE — Assessment & Plan Note (Signed)
 Doing well

## 2023-12-18 NOTE — Assessment & Plan Note (Addendum)
 Kristy Keller was on Ozempic  (she did not loose much wt). Tirzepetide prescribed today Check A1c

## 2023-12-21 ENCOUNTER — Ambulatory Visit: Payer: Self-pay | Admitting: Internal Medicine

## 2023-12-28 ENCOUNTER — Other Ambulatory Visit: Payer: Self-pay | Admitting: Internal Medicine

## 2023-12-28 MED ORDER — TRIAMCINOLONE ACETONIDE 0.1 % EX OINT: 1.0000 | TOPICAL_OINTMENT | Freq: Two times a day (BID) | CUTANEOUS | 1 refills | Status: AC

## 2024-02-07 ENCOUNTER — Ambulatory Visit (INDEPENDENT_AMBULATORY_CARE_PROVIDER_SITE_OTHER)

## 2024-02-07 VITALS — BP 140/90 | HR 69 | Ht 65.0 in | Wt 239.6 lb

## 2024-02-07 DIAGNOSIS — Z0000 Encounter for general adult medical examination without abnormal findings: Secondary | ICD-10-CM | POA: Diagnosis not present

## 2024-02-07 DIAGNOSIS — E119 Type 2 diabetes mellitus without complications: Secondary | ICD-10-CM | POA: Diagnosis not present

## 2024-02-07 NOTE — Progress Notes (Signed)
 Subjective:   Kristy Keller is a 63 y.o. who presents for a Medicare Wellness preventive visit.  As a reminder, Annual Wellness Visits don't include a physical exam, and some assessments may be limited, especially if this visit is performed virtually. We may recommend an in-person follow-up visit with your provider if needed.  Visit Complete: In person  Persons Participating in Visit: Patient.  AWV Questionnaire: Yes: Patient Medicare AWV questionnaire was completed by the patient on 02/03/2024; I have confirmed that all information answered by patient is correct and no changes since this date.  Cardiac Risk Factors include: hypertension;diabetes mellitus;Other (see comment), Risk factor comments: Fatty liver disease     Objective:    Today's Vitals   02/03/24 0743 02/07/24 0937  Weight:  239 lb 9.6 oz (108.7 kg)  Height:  5' 5 (1.651 m)  PainSc: 7     Body mass index is 39.87 kg/m.     02/07/2024    9:51 AM 02/21/2023    8:21 AM  Advanced Directives  Does Patient Have a Medical Advance Directive? Yes Yes  Type of Estate agent of [place];Living will Healthcare Power of [place];Living will  Copy of Healthcare Power of Attorney in Chart? No - copy requested No - copy requested    Current Medications (verified) Outpatient Encounter Medications as of 02/07/2024  Medication Sig   acetaminophen  (TYLENOL ) 500 MG tablet Take 500 mg by mouth every 5 (five) hours as needed for headache.   aspirin  EC 81 MG tablet Take 1 tablet (81 mg total) by mouth daily. Swallow whole.   Cholecalciferol  (VITAMIN D3) 2000 units capsule Take 1 capsule (2,000 Units total) by mouth daily.   metoprolol  succinate (TOPROL -XL) 25 MG 24 hr tablet TAKE 1 TABLET BY MOUTH EVERYDAY AT BEDTIME   nitroGLYCERIN  (NITROSTAT ) 0.4 MG SL tablet Place 0.4 mg under the tongue every 5 (five) minutes as needed for chest pain.   tirzepatide  (MOUNJARO ) 2.5 MG/0.5ML Pen Inject 2.5 mg into the skin  once a week.   triamcinolone  ointment (KENALOG ) 0.1 % Apply 1 Application topically 2 (two) times daily.   cefdinir  (OMNICEF ) 300 MG capsule Take 1 capsule (300 mg total) by mouth 2 (two) times daily. (Patient not taking: Reported on 02/07/2024)   metoprolol  tartrate (LOPRESSOR ) 50 MG tablet Take 50 mg by mouth. (Patient not taking: Reported on 02/07/2024)   nystatin (MYCOSTATIN/NYSTOP) powder Apply topically 2 (two) times daily. (Patient not taking: Reported on 02/07/2024)   phentermine  (ADIPEX-P ) 37.5 MG tablet Take 1 tablet (37.5 mg total) by mouth daily before breakfast. (Patient not taking: Reported on 02/07/2024)   promethazine -dextromethorphan (PROMETHAZINE -DM) 6.25-15 MG/5ML syrup Take 5 mLs by mouth 4 (four) times daily as needed for cough. (Patient not taking: Reported on 02/07/2024)   No facility-administered encounter medications on file as of 02/07/2024.    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 Asymptomatic, with borderline but controlled blood pressure. Advised to continue monitoring her blood pressure, and to continue with dietary modifications. No pharmacological changes to her management at this visit.   Breast lump 02/21/2018   Cancer of skin, squamous cell 03/12/2019   Formatting of this note  might be different from the original. possible right peri orbital   Cat bite of hand, right, sequela 12/21/2020   Cellulitis of right hand 12/08/2020   Formatting of this note might be different from the original. Added automatically from request for surgery [id number]   Chest pain 02/19/2022   Chicken pox    Chronic venous  insufficiency 11/07/2017   2019 B Support socks knee high Elevate legs Low salt diet Loose 10-20 lbs Spironolactone    Class 2 severe obesity due to excess calories with serious comorbidity in adult [facility]) 10/06/2019   Classic migraine with aura 03/12/2019   Diabetes mellitus without complication ([name])    Edema 06/05/2017   Due to gabapentin  - resolved off Rx 11/18  Spironolactone    Family history of early CAD 03/05/2017   Baby ASA qd   Family history of heart disease in brother 02/19/2022   Gallbladder pain 02/21/2018   Gallstone 03/05/2017   2014 large 2018 US : Cholelithiasis with confluence of gallstones measuring 2.5 cm in length, similar to prior study. No gallbladder wall thickening or pericholecystic fluid   History of hysterectomy 09/04/2022   Hypermetropia of both eyes 03/12/2019   Hypokalemia 02/19/2022   Infected sebaceous cyst 07/19/2017   2/19 back   Insomnia 12/30/2017   2019 refractory Zolpidem  prn - not taking Zolpidem   Potential benefits of a long term benzodiazepines  use as well as potential risks  and complications were explained to the patient and were aknowledged.     Intertrigo 09/04/2022   Localized swelling, mass, or lump of lower extremity, bilateral 01/17/2022   Last Assessment & Plan:  Formatting of this note might be different from the original. The symmetric swelling and erythema, as well has her orthopnea, point towards a cardiac etiology as opposed to deep venous thrombosis. Worsening chronic venous insufficiency is possibly the sole cause of this, and at least a contributory etiology.  CBC, BMP, and pro-BNP ordered. Prescription for 40 mg of Lasix ,    Low back pain 09/04/2022   with radiculopathy with radiculopathy   Morbid obesity ([name]) 02/28/2022   Multilevel lumbosacral spondylosis with radiculopathy 04/12/2017   Nodule of left external ear 10/23/2021   Last Assessment & Plan:  Formatting of this note might be different from the original. Benign  examination; possibly an area of thickened skin due to friction, dryness, and repeated manipulation by the patient. Advised she avoid alcohol application and use of neosporin. Suggested she try moisturizer to avoid dryness and further irritation.   Nonalcoholic fatty liver disease 02/19/2022   Nuclear sclerotic cataract of both eyes 03/12/2019   OAG (open angle glaucoma) suspect, low risk, bilateral 03/12/2019   Orthopnea 01/17/2022   Last Assessment & Plan:  Formatting of this note might be different from the original. Her cardiac workup in the hospital was reassuring, though her orthopnea and increased swelling in her extremities are concerning. Her lung exam is reassuring, but in consideration of recent events, a plain radiograph of the chest, CBC, BMP, and pro-BNP have been ordered.  Increasing her Lasix  to 40 mg twice dail   Pasteurella cellulitis due to cat bite 12/21/2020   Postlaminectomy syndrome of lumbar region 05/19/2021   Formatting of this note might be different from the original. Added automatically from request for surgery [id number]   Preop exam for internal medicine 03/05/2017   [name] is medically clear for her L4 decompression/fusion. Thank you! EKG - nl Labs, CXR - OK Stress test discussed. [name] declined. She was very active  prior to her radiculopathy w/o CPs Baby ASA qd   Presbyopia 03/12/2019   Pseudophakia of left eye 07/15/2022   Pseudophakia of right eye 07/15/2022   Pyogenic arthritis of hand ([name]) 12/21/2020   Formatting of this note might be different from the original. Added automatically from request for surgery [id number]   Radiculopathy of leg 03/06/2017   01/2017 Dr Marlyce  LBP - s/p L4 decompr and fusion surgery Apr 10, 2017. LBP and RLE pain is better Flexeril Loose weight Join a gentle yoga class   Regular astigmatism of both eyes 03/12/2019   Rupture of extensor tendon of right hand 12/09/2020   Formatting of this note might be different from the original. S/p  repair Dr Jenna 12-09-2020   Seborrheic keratosis 09/04/2022   Spinal stenosis of lumbar region with neurogenic claudication 06/07/2021   SVT (supraventricular tachycardia) ([name]) 11/17/2019   Last Assessment & Plan:  Formatting of this note might be different from the original. Examination suggests she's experiencing supraventricular tachycardia again. Attempted a carotid sinus massage, after explaining the process and obtaining verbal consent, of each side for 10-15 seconds. Failure to convert her back to normal rate despite multiple attempts.   Advised that she'd have to go to the ED   Unstable angina ([name]) 02/19/2022   Past Surgical History:  Procedure Laterality Date   ABDOMINAL HYSTERECTOMY     partial   EYE SURGERY Bilateral 05/2022   Cataract   Family History  Problem Relation Age of Onset   Heart disease Mother 25       CHF, CAD   Cancer Father 3       lung ca   Diabetes Sister    Heart disease Brother 73       CAD, CABG   Colon cancer Neg Hx    Esophageal cancer Neg Hx    Rectal cancer Neg Hx    Stomach cancer Neg Hx    Social History   Socioeconomic History   Marital status: Married    Spouse name: Phillp   Number of children: Not on file   Years of education: Not on file   Highest education level: 11th grade  Occupational History   Occupation: Disabled  Tobacco Use   Smoking status: Former   Smokeless tobacco: Never  Advertising account planner   Vaping status: Never Used  Substance and Sexual Activity   Alcohol use: No   Drug use: No   Sexual activity: Never    Partners: Male  Other Topics Concern   Not on file  Social History Narrative   Lives with husband/ and 1 cat/2025   Social Drivers of Health   Financial Resource Strain: Low Risk  (02/07/2024)   Overall Financial Resource Strain (CARDIA)    Difficulty of Paying Living Expenses: Not hard at all  Food Insecurity: No Food Insecurity (02/07/2024)   Hunger Vital Sign    Worried About Running Out of Food in the  Last Year: Never true    Ran Out of Food in the Last Year: Never true  Transportation Needs: No Transportation Needs (02/07/2024)   PRAPARE - Administrator, Civil Service (Medical): No    Lack of Transportation (Non-Medical): No  Physical Activity: Inactive (02/07/2024)   Exercise Vital Sign    Days of Exercise per Week: 0 days    Minutes of Exercise per Session: 0 min  Stress: Stress Concern Present (02/07/2024)   [employer] of Occupational Health - Occupational Stress Questionnaire  Feeling of Stress: To some extent  Social Connections: Socially Isolated (02/07/2024)   Social Connection and Isolation Panel    Frequency of Communication with Friends and Family: Twice a week    Frequency of Social Gatherings with Friends and Family: Never    Attends Religious Services: Never    Diplomatic Services operational officer: No    Attends Engineer, structural: Never    Marital Status: Married    Tobacco Counseling Counseling given: Not Answered    Clinical Intake:  Pre-visit preparation completed: Yes  Pain : 0-10 Pain Score: 7  Pain Type: Chronic pain Pain Location: Knee Pain Orientation: Right Pain Descriptors / Indicators: Aching Pain Onset: More than a month ago Pain Frequency: Constant Pain Relieving Factors: Extra strenghth Tylenol / Gets injections  Pain Relieving Factors: Extra strenghth Tylenol / Gets injections  BMI - recorded: 39.87 Nutritional Status: BMI > 30  Obese Nutritional Risks: None Diabetes: Yes CBG done?: No Did pt. bring in CBG monitor from home?: No  Lab Results  Component Value Date   HGBA1C 7.3 (H) 12/18/2023   HGBA1C 5.6 05/07/2023   HGBA1C 6.8 (H) 11/26/2022     How often do you need to have someone help you when you read instructions, pamphlets, or other written materials from your doctor or pharmacy?: 1 - Never  Interpreter Needed?: No  Information entered by :: Sallie Staron, RMA   Activities of Daily Living      02/03/2024    7:43 AM 02/17/2023    9:27 AM  In your present state of health, do you have any difficulty performing the following activities:  Hearing? 0 0  Vision? 0 0  Difficulty concentrating or making decisions? 0 0  Walking or climbing stairs? 0 0  Dressing or bathing? 0 0  Doing errands, shopping? 0 0  Preparing Food and eating ? N N  Using the Toilet? N N  In the past six months, have you accidently leaked urine? N N  Do you have problems with loss of bowel control? N N  Managing your Medications? N N  Managing your Finances? N N  Housekeeping or managing your Housekeeping? N N    Patient Care Team: Plotnikov, Karlynn GAILS, MD as PCP - General (Internal Medicine) Lenon Oneil BRAVO, MD as Consulting Physician (Obstetrics and Gynecology) Evern Donnajean Kayser, MD as Referring Physician (Orthopedic Surgery) Evern Donnajean Kayser, MD as Referring Physician (Orthopedic Surgery) Revankar, Jennifer SAUNDERS, MD as Consulting Physician (Cardiology) Beverlee Modesto GAILS, MD as Referring Physician (Ophthalmology)  I have updated your Care Teams any recent Medical Services you may have received from other providers in the past year.     Assessment:   This is a routine wellness examination for [place].  Hearing/Vision screen Hearing Screening - Comments:: Denies hearing difficulties     Goals Addressed               This Visit's Progress     Patient Stated (pt-stated)   On track     Would like to lose weight.       Depression Screen     02/07/2024    9:55 AM 12/18/2023    8:05 AM 05/07/2023    8:07 AM 05/07/2023    8:06 AM 02/21/2023    8:25 AM 12/03/2022   10:41 AM 03/05/2017   10:41 AM  PHQ 2/9 Scores  PHQ - 2 Score 0 0 0 0 6 0 0  PHQ- 9 Score 3  14      Fall Risk     02/03/2024    7:43 AM 12/18/2023    8:05 AM 05/07/2023    8:06 AM 02/17/2023    9:27 AM 12/03/2022   10:41 AM  Fall Risk   Falls in the past year? 0 0 0 0 0  Number falls in past yr:  0 0  0   Injury with Fall?  0 0  0  Risk for fall due to :  No Fall Risks No Fall Risks No Fall Risks No Fall Risks  Follow up Falls evaluation completed;Falls prevention discussed Falls evaluation completed Falls evaluation completed Falls evaluation completed;Follow up appointment Falls evaluation completed    MEDICARE RISK AT HOME:  Medicare Risk at Home Any stairs in or around the home?: (Patient-Rptd) Yes If so, are there any without handrails?: (Patient-Rptd) No Home free of loose throw rugs in walkways, pet beds, electrical cords, etc?: (Patient-Rptd) Yes Adequate lighting in your home to reduce risk of falls?: (Patient-Rptd) Yes Life alert?: (Patient-Rptd) No Use of a cane, walker or w/c?: (Patient-Rptd) No Grab bars in the bathroom?: (Patient-Rptd) Yes Shower chair or bench in shower?: (Patient-Rptd) Yes Elevated toilet seat or a handicapped toilet?: (Patient-Rptd) No  TIMED UP AND GO:  Was the test performed?  Yes  Length of time to ambulate 10 feet: 20 sec Gait slow and steady without use of assistive device  Cognitive Function: Declined/Normal: No cognitive concerns noted by patient or family. Patient alert, oriented, able to answer questions appropriately and recall recent events. No signs of memory loss or confusion.        02/21/2023    8:22 AM  6CIT Screen  What Year? 0 points  What month? 0 points  What time? 0 points  Count back from 20 0 points  Months in reverse 2 points  Repeat phrase 2 points  Total Score 4 points    Immunizations Immunization History  Administered Date(s) Administered   Hepatitis B 06/04/2012   Pneumococcal Polysaccharide-23 05/19/2013   Tdap 03/06/2015, 12/02/2020    Screening Tests Health Maintenance  Topic Date Due   COVID-19 Vaccine (1) Never done   FOOT EXAM  Never done   HIV Screening  Never done   Diabetic kidney evaluation - Urine ACR  Never done   Hepatitis C Screening  Never done   Zoster Vaccines- Shingrix (1 of 2)  Never done   Hepatitis B Vaccines 19-59 Average Risk (2 of 3 - 19+ 3-dose series) 07/02/2012   Pneumococcal Vaccine: 50+ Years (2 of 2 - PCV) 05/19/2014   OPHTHALMOLOGY EXAM  10/05/2023   Influenza Vaccine  Never done   HEMOGLOBIN A1C  06/19/2024   Diabetic kidney evaluation - eGFR measurement  12/17/2024   Medicare Annual Wellness (AWV)  02/06/2025   MAMMOGRAM  03/19/2025   Colonoscopy  03/28/2026   DTaP/Tdap/Td (3 - Td or Tdap) 12/03/2030   HPV VACCINES  Aged Out   Meningococcal B Vaccine  Aged Out    Health Maintenance  Health Maintenance Due  Topic Date Due   COVID-19 Vaccine (1) Never done   FOOT EXAM  Never done   HIV Screening  Never done   Diabetic kidney evaluation - Urine ACR  Never done   Hepatitis C Screening  Never done   Zoster Vaccines- Shingrix (1 of 2) Never done   Hepatitis B Vaccines 19-59 Average Risk (2 of 3 - 19+ 3-dose series) 07/02/2012   Pneumococcal Vaccine: 50+ Years (  2 of 2 - PCV) 05/19/2014   OPHTHALMOLOGY EXAM  10/05/2023   Influenza Vaccine  Never done   Health Maintenance Items Addressed: Diabetic Foot Exam recommended, Labs Ordered: Hep C screening, HIV screening and a UACR, See Nurse Notes at the end of this note  Additional Screening:  Vision Screening: Recommended annual ophthalmology exams for early detection of glaucoma and other disorders of the eye. Would you like a referral to an eye doctor? No    Dental Screening: Recommended annual dental exams for proper oral hygiene  Community Resource Referral / Chronic Care Management: CRR required this visit?  No   CCM required this visit?  No   Plan:    I have personally reviewed and noted the following in the patient's chart:   Medical and social history Use of alcohol, tobacco or illicit drugs  Current medications and supplements including opioid prescriptions. Patient is not currently taking opioid prescriptions. Functional ability and status Nutritional status Physical  activity Advanced directives List of other physicians Hospitalizations, surgeries, and ER visits in previous 12 months Vitals Screenings to include cognitive, depression, and falls Referrals and appointments  In addition, I have reviewed and discussed with patient certain preventive protocols, quality metrics, and best practice recommendations. A written personalized care plan for preventive services as well as general preventive health recommendations were provided to patient.   Laurena Valko L Mendell Bontempo, CMA   02/07/2024   After Visit Summary: (MyChart) Due to this being a telephonic visit, the after visit summary with patients personalized plan was offered to patient via MyChart   Notes: Patient is due for a foot exam, an Hep C screening, a HIV screening and a UACR (oreder placed today), which all can be done during her next office visit.  She declines all vaccines at this time. Patient stated that she has an diabetic eye exam coming up next month.  She also stated that she had to reschedule her mammogram, due to a rush under her breast.  Patient had no other concerns to address today.

## 2024-02-07 NOTE — Patient Instructions (Signed)
 Ms. Bonaparte,  Thank you for taking the time for your Medicare Wellness Visit. I appreciate your continued commitment to your health goals. Please review the care plan we discussed, and feel free to reach out if I can assist you further.  Medicare recommends these wellness visits once per year to help you and your care team stay ahead of potential health issues. These visits are designed to focus on prevention, allowing your provider to concentrate on managing your acute and chronic conditions during your regular appointments.  Please note that Annual Wellness Visits do not include a physical exam. Some assessments may be limited, especially if the visit was conducted virtually. If needed, we may recommend a separate in-person follow-up with your provider.  Ongoing Care Seeing your primary care provider every 3 to 6 months helps us  monitor your health and provide consistent, personalized care. Next office visit on 03/19/2024.  Referrals If a referral was made during today's visit and you haven't received any updates within two weeks, please contact the referred provider directly to check on the status.  Recommended Screenings:  Health Maintenance  Topic Date Due   COVID-19 Vaccine (1) Never done   Complete foot exam   Never done   HIV Screening  Never done   Yearly kidney health urinalysis for diabetes  Never done   Hepatitis C Screening  Never done   Zoster (Shingles) Vaccine (1 of 2) Never done   Hepatitis B Vaccine (2 of 3 - 19+ 3-dose series) 07/02/2012   Pneumococcal Vaccine for age over 74 (2 of 2 - PCV) 05/19/2014   Eye exam for diabetics  10/05/2023   Flu Shot  Never done   Hemoglobin A1C  06/19/2024   Yearly kidney function blood test for diabetes  12/17/2024   Medicare Annual Wellness Visit  02/06/2025   Mammogram  03/19/2025   Colon Cancer Screening  03/28/2026   DTaP/Tdap/Td vaccine (3 - Td or Tdap) 12/03/2030   HPV Vaccine  Aged Out   Meningitis B Vaccine  Aged Out        02/21/2023    8:21 AM  Advanced Directives  Does Patient Have a Medical Advance Directive? Yes  Type of Estate agent of [place];Living will  Copy of Healthcare Power of Attorney in Chart? No - copy requested   Advance Care Planning is important because it: Ensures you receive medical care that aligns with your values, goals, and preferences. Provides guidance to your family and loved ones, reducing the emotional burden of decision-making during critical moments.  Vision: Annual vision screenings are recommended for early detection of glaucoma, cataracts, and diabetic retinopathy. These exams can also reveal signs of chronic conditions such as diabetes and high blood pressure.  Dental: Annual dental screenings help detect early signs of oral cancer, gum disease, and other conditions linked to overall health, including heart disease and diabetes.  Please see the attached documents for additional preventive care recommendations. You are due for a foot exam, a Hep C screening, an HIV screening and a kidney evaluation (urine sample).  You will have these done during your next office visit with Dr. Garald.

## 2024-02-14 ENCOUNTER — Telehealth: Payer: Self-pay | Admitting: Radiology

## 2024-02-14 NOTE — Telephone Encounter (Signed)
 Copied from CRM [id number]. Topic: Clinical - Medication Question >> Feb 14, 2024 10:28 AM [place] E wrote: Reason for CRM: Patient called in stating that [employer] needs a referral for the patient's tirzepatide  (MOUNJARO ) 2.5 MG/0.5ML Pen including the medical necessity. Patient relayed phone number for that insurance company being [phone].

## 2024-02-16 NOTE — Telephone Encounter (Signed)
 She probably meant prior authorization for Mounjaro .  Please ask for a prior authorization.  Diagnosis-diabetes type 2 Thanks

## 2024-02-18 ENCOUNTER — Other Ambulatory Visit (HOSPITAL_COMMUNITY): Payer: Self-pay

## 2024-02-18 ENCOUNTER — Telehealth: Payer: Self-pay

## 2024-02-18 NOTE — Telephone Encounter (Signed)
 Per test claim: Refill too soon. PA is not needed at this time. Medication was filled 02/04/24. Next eligible fill date is 02/25/24.

## 2024-02-18 NOTE — Telephone Encounter (Signed)
 Pharmacy Patient Advocate Encounter   Received notification from Pt Calls Messages that prior authorization for Mounjaro  2.5mg /0.53ml is required/requested.   Insurance verification completed.   The patient is insured through [facility] ADVANTAGE/RX ADVANCE .   Per test claim: Refill too soon. PA is not needed at this time. Medication was filled 02/04/24. Next eligible fill date is 02/25/24.

## 2024-02-19 ENCOUNTER — Other Ambulatory Visit: Payer: Self-pay | Admitting: Internal Medicine

## 2024-02-19 ENCOUNTER — Other Ambulatory Visit (HOSPITAL_COMMUNITY): Payer: Self-pay

## 2024-02-19 NOTE — Telephone Encounter (Signed)
 Spoke with a representative at [employer] and they faxed over a form for us  to fill out but they will allow 2 fills at the pharmacy to insure time for the PA to get resolved.   I have submitted a form via fax.

## 2024-02-21 ENCOUNTER — Other Ambulatory Visit (HOSPITAL_COMMUNITY): Payer: Self-pay

## 2024-02-21 NOTE — Telephone Encounter (Signed)
 Pharmacy Patient Advocate Encounter  Received notification from [facility] [employer]/RX [employer] that Prior Authorization for Mounjaro  2.5mg /0.21ml has been APPROVED from 02/19/24 to 02/18/25   Approval letter indexed to media tab

## 2024-02-24 ENCOUNTER — Other Ambulatory Visit: Payer: Self-pay

## 2024-02-24 DIAGNOSIS — E119 Type 2 diabetes mellitus without complications: Secondary | ICD-10-CM

## 2024-02-24 MED ORDER — MOUNJARO 2.5 MG/0.5ML ~~LOC~~ SOAJ: 2.5000 mg | SUBCUTANEOUS | 2 refills | Status: DC

## 2024-03-05 ENCOUNTER — Encounter (HOSPITAL_BASED_OUTPATIENT_CLINIC_OR_DEPARTMENT_OTHER): Payer: Self-pay | Admitting: Student

## 2024-03-05 ENCOUNTER — Ambulatory Visit (INDEPENDENT_AMBULATORY_CARE_PROVIDER_SITE_OTHER)

## 2024-03-05 ENCOUNTER — Ambulatory Visit (INDEPENDENT_AMBULATORY_CARE_PROVIDER_SITE_OTHER): Admitting: Student

## 2024-03-05 ENCOUNTER — Ambulatory Visit (HOSPITAL_BASED_OUTPATIENT_CLINIC_OR_DEPARTMENT_OTHER): Admitting: Student

## 2024-03-05 DIAGNOSIS — M1711 Unilateral primary osteoarthritis, right knee: Secondary | ICD-10-CM | POA: Diagnosis not present

## 2024-03-05 DIAGNOSIS — M25461 Effusion, right knee: Secondary | ICD-10-CM | POA: Diagnosis not present

## 2024-03-05 DIAGNOSIS — G8929 Other chronic pain: Secondary | ICD-10-CM | POA: Diagnosis not present

## 2024-03-05 DIAGNOSIS — M25561 Pain in right knee: Secondary | ICD-10-CM | POA: Diagnosis not present

## 2024-03-05 NOTE — Progress Notes (Signed)
 Chief Complaint: Right knee pain    Discussed the use of AI scribe software for clinical note transcription with the patient, who gave verbal consent to proceed.  History of Present Illness Kristy Keller is a pleasant 63 year old female with arthritis who presents with worsening right knee pain. She is accompanied by her sister. She was previously seen by Dr. Alford, who has since moved out of the area. She has experienced right knee pain for approximately five years, with significant worsening recently. Her work in [employer] involved standing on concrete floors and lifting heavy rolls of fabric, which may have contributed to her condition.  Cortisone injections in both knees initially provided relief for five to six months, but the most recent injection was less effective and more painful. The pain is severe, causing instability and preventing her from putting pressure on the knee. It is primarily located on the medial aspect of the right knee, with occasional swelling. There is no pain around the patella, but the knee often 'pops'. Current medications include Mounjaro  and extra strength Tylenol  taken at night and in the morning for pain management. Tylenol  is not effectively managing her pain, requiring additional doses during the night. She has been disabled since 2017 due to back issues, having undergone two back surgeries.  Surgical History:   None  PMH/PSH/Family History/Social History/Meds/Allergies:    Past Medical History:  Diagnosis Date   Age-related nuclear cataract, bilateral 06/04/2021   Allergy    Angina pectoris 02/28/2022   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 Asymptomatic, with borderline but controlled blood pressure. Advised to continue monitoring her blood pressure, and to  continue with dietary modifications. No pharmacological changes to her management at this visit.   Breast lump 02/21/2018   Cancer of skin, squamous cell 03/12/2019   Formatting of this note might be different from the original. possible right peri orbital   Cat bite of hand, right, sequela 12/21/2020   Cellulitis of right hand 12/08/2020   Formatting of this note might be different from the original. Added automatically from request for surgery [id number]   Chest pain 02/19/2022   Chicken pox    Chronic venous insufficiency 11/07/2017   2019 B Support socks knee high Elevate legs Low salt diet Loose 10-20 lbs Spironolactone    Class 2 severe obesity due to excess calories with serious comorbidity in adult 10/06/2019   Classic migraine with aura 03/12/2019   Diabetes mellitus without complication ([name])    Edema 06/05/2017   Due to gabapentin  - resolved off Rx 11/18  Spironolactone    Family history of early CAD 03/05/2017   Baby ASA qd   Family history of heart disease in brother 02/19/2022   Gallbladder pain 02/21/2018   Gallstone 03/05/2017   2014 large 2018 US : Cholelithiasis with confluence of gallstones measuring 2.5 cm in length, similar to prior study. No gallbladder wall thickening or pericholecystic fluid   History of hysterectomy 09/04/2022   Hypermetropia of both eyes 03/12/2019   Hypokalemia 02/19/2022   Infected sebaceous cyst 07/19/2017   2/19 back   Insomnia 12/30/2017   2019 refractory Zolpidem  prn - not taking Zolpidem   Potential benefits of a long  term benzodiazepines  use as well as potential risks  and complications were explained to the patient and were aknowledged.     Intertrigo 09/04/2022   Localized swelling, mass, or lump of lower extremity, bilateral 01/17/2022   Last Assessment & Plan:  Formatting of this note might be different from the original. The symmetric swelling and erythema, as well has her orthopnea, point towards a cardiac etiology as opposed to deep  venous thrombosis. Worsening chronic venous insufficiency is possibly the sole cause of this, and at least a contributory etiology.  CBC, BMP, and pro-BNP ordered. Prescription for 40 mg of Lasix ,    Low back pain 09/04/2022   with radiculopathy with radiculopathy   Morbid obesity ([name]) 02/28/2022   Multilevel lumbosacral spondylosis with radiculopathy 04/12/2017   Nodule of left external ear 10/23/2021   Last Assessment & Plan:  Formatting of this note might be different from the original. Benign examination; possibly an area of thickened skin due to friction, dryness, and repeated manipulation by the patient. Advised she avoid alcohol application and use of neosporin. Suggested she try moisturizer to avoid dryness and further irritation.   Nonalcoholic fatty liver disease 02/19/2022   Nuclear sclerotic cataract of both eyes 03/12/2019   OAG (open angle glaucoma) suspect, low risk, bilateral 03/12/2019   Orthopnea 01/17/2022   Last Assessment & Plan:  Formatting of this note might be different from the original. Her cardiac workup in the hospital was reassuring, though her orthopnea and increased swelling in her extremities are concerning. Her lung exam is reassuring, but in consideration of recent events, a plain radiograph of the chest, CBC, BMP, and pro-BNP have been ordered.  Increasing her Lasix  to 40 mg twice dail   Pasteurella cellulitis due to cat bite 12/21/2020   Postlaminectomy syndrome of lumbar region 05/19/2021   Formatting of this note might be different from the original. Added automatically from request for surgery [id number]   Preop exam for internal medicine 03/05/2017   [name] is medically clear for her L4 decompression/fusion. Thank you! EKG - nl Labs, CXR - OK Stress test discussed. [name] declined. She was very active prior to her radiculopathy w/o CPs Baby ASA qd   Presbyopia 03/12/2019   Pseudophakia of left eye 07/15/2022   Pseudophakia of right eye 07/15/2022   Pyogenic  arthritis of hand ([name]) 12/21/2020   Formatting of this note might be different from the original. Added automatically from request for surgery [id number]   Radiculopathy of leg 03/06/2017   01/2017 Dr Marlyce  LBP - s/p L4 decompr and fusion surgery Apr 10, 2017. LBP and RLE pain is better Flexeril Loose weight Join a gentle yoga class   Regular astigmatism of both eyes 03/12/2019   Rupture of extensor tendon of right hand 12/09/2020   Formatting of this note might be different from the original. S/p repair Dr Jenna 12-09-2020   Seborrheic keratosis 09/04/2022   Spinal stenosis of lumbar region with neurogenic claudication 06/07/2021   SVT (supraventricular tachycardia) 11/17/2019   Last Assessment & Plan:  Formatting of this note might be different from the original. Examination suggests she's experiencing supraventricular tachycardia again. Attempted a carotid sinus massage, after explaining the process and obtaining verbal consent, of each side for 10-15 seconds. Failure to convert her back to normal rate despite multiple attempts.   Advised that she'd have to go to the ED   Unstable angina ([name]) 02/19/2022   Past Surgical History:  Procedure Laterality Date   ABDOMINAL HYSTERECTOMY  partial   EYE SURGERY Bilateral 05/2022   Cataract   Social History   Socioeconomic History   Marital status: Married    Spouse name: Phillp   Number of children: Not on file   Years of education: Not on file   Highest education level: 11th grade  Occupational History   Occupation: Disabled  Tobacco Use   Smoking status: Former   Smokeless tobacco: Never  Advertising account planner   Vaping status: Never Used  Substance and Sexual Activity   Alcohol use: No   Drug use: No   Sexual activity: Never    Partners: Male  Other Topics Concern   Not on file  Social History Narrative   Lives with husband/ and 1 cat/2025   Social Drivers of Health   Financial Resource Strain: Low Risk  (02/07/2024)   Overall  Financial Resource Strain (CARDIA)    Difficulty of Paying Living Expenses: Not hard at all  Food Insecurity: No Food Insecurity (02/07/2024)   Hunger Vital Sign    Worried About Running Out of Food in the Last Year: Never true    Ran Out of Food in the Last Year: Never true  Transportation Needs: No Transportation Needs (02/07/2024)   PRAPARE - Administrator, Civil Service (Medical): No    Lack of Transportation (Non-Medical): No  Physical Activity: Inactive (02/07/2024)   Exercise Vital Sign    Days of Exercise per Week: 0 days    Minutes of Exercise per Session: 0 min  Stress: Stress Concern Present (02/07/2024)   [employer] of Occupational Health - Occupational Stress Questionnaire    Feeling of Stress: To some extent  Social Connections: Socially Isolated (02/07/2024)   Social Connection and Isolation Panel    Frequency of Communication with Friends and Family: Twice a week    Frequency of Social Gatherings with Friends and Family: Never    Attends Religious Services: Never    Diplomatic Services operational officer: No    Attends Engineer, structural: Never    Marital Status: Married   Family History  Problem Relation Age of Onset   Heart disease Mother 1       CHF, CAD   Cancer Father 24       lung ca   Diabetes Sister    Heart disease Brother 65       CAD, CABG   Colon cancer Neg Hx    Esophageal cancer Neg Hx    Rectal cancer Neg Hx    Stomach cancer Neg Hx    Allergies  Allergen Reactions   Ketorolac      Suicide ideations after 3 doses.   Gabapentin  Swelling    Leg swelling   Lyrica [Pregabalin] Other (See Comments)    headaches   Phentermine  Other (See Comments)    SVT   Current Outpatient Medications  Medication Sig Dispense Refill   acetaminophen  (TYLENOL ) 500 MG tablet Take 500 mg by mouth every 5 (five) hours as needed for headache.     aspirin  EC 81 MG tablet Take 1 tablet (81 mg total) by mouth daily. Swallow whole. 90 tablet 3    cefdinir  (OMNICEF ) 300 MG capsule Take 1 capsule (300 mg total) by mouth 2 (two) times daily. (Patient not taking: Reported on 02/07/2024) 20 capsule 0   Cholecalciferol  (VITAMIN D3) 2000 units capsule Take 1 capsule (2,000 Units total) by mouth daily. 100 capsule 3   metoprolol  succinate (TOPROL -XL) 25 MG 24 hr tablet  TAKE 1 TABLET BY MOUTH EVERYDAY AT BEDTIME 90 tablet 3   metoprolol  tartrate (LOPRESSOR ) 50 MG tablet Take 50 mg by mouth. (Patient not taking: Reported on 02/07/2024)     nitroGLYCERIN  (NITROSTAT ) 0.4 MG SL tablet Place 0.4 mg under the tongue every 5 (five) minutes as needed for chest pain.     nystatin (MYCOSTATIN/NYSTOP) powder Apply topically 2 (two) times daily. (Patient not taking: Reported on 02/07/2024)     phentermine  (ADIPEX-P ) 37.5 MG tablet Take 1 tablet (37.5 mg total) by mouth daily before breakfast. (Patient not taking: Reported on 02/07/2024) 30 tablet 2   promethazine -dextromethorphan (PROMETHAZINE -DM) 6.25-15 MG/5ML syrup Take 5 mLs by mouth 4 (four) times daily as needed for cough. (Patient not taking: Reported on 02/07/2024) 240 mL 0   tirzepatide  (MOUNJARO ) 2.5 MG/0.5ML Pen Inject 2.5 mg into the skin once a week. 2 mL 2   triamcinolone  ointment (KENALOG ) 0.1 % Apply 1 Application topically 2 (two) times daily. 80 g 1   No current facility-administered medications for this visit.   No results found.  Review of Systems:   A ROS was performed including pertinent positives and negatives as documented in the HPI.  Physical Exam :   Constitutional: NAD and appears stated age Neurological: Alert and oriented Psych: Appropriate affect and cooperative There were no vitals taken for this visit.   Comprehensive Musculoskeletal Exam:    Exam of the right knee demonstrates tenderness over bilateral joint lines, worse medially.  Active range of motion is from 0 to 110 degrees with mild crepitus.  Negative for effusion, erythema, or warmth.  Stable collaterals with varus and  valgus stress.  Imaging:   Xray (right knee 4 views): Tricompartmental osteoarthritis that appears to moderate in the medial compartment with joint space narrowing and osteophyte formation.  Mild degenerative changes within the lateral and patellofemoral compartments.   I personally reviewed and interpreted the radiographs.      Assessment & Plan Right knee osteoarthritis   Chronic osteoarthritis with moderate medial and mild lateral and patellofemoral involvement. Diminished relief from last cortisone injections which had helped more historically. Recent weight loss noted.  She is diabetic with last A1c of 7.3.  Discussed treatment options including low impact exercise, NSAIDs, and injections.  Plan to submit for insurance authorization for viscosupplementation for the right knee given limited benefit of last cortisone injections. Advise over-the-counter ibuprofen for pain management until gel injection is approved. Discuss potential use of cortisone injections if gel injections do not provide relief after 4-6 weeks.      This patient is diagnosed with osteoarthritis of the knee(s).    Radiographs show evidence of joint space narrowing, osteophytes, subchondral sclerosis and/or subchondral cysts.  This patient has knee pain which interferes with functional and activities of daily living.    This patient has experienced inadequate response, adverse effects and/or intolerance with conservative treatments such as acetaminophen , NSAIDS, topical creams, physical therapy or regular exercise, knee bracing and/or weight loss.   This patient has experienced inadequate response or has a contraindication to intra articular steroid injections for at least 3 months.   This patient is not scheduled to have a total knee replacement within 6 months of starting treatment with viscosupplementation.    I personally saw and evaluated the patient, and participated in the management and treatment  plan.  Leonce Reveal, PA-C Orthopedics

## 2024-03-09 ENCOUNTER — Other Ambulatory Visit: Payer: Self-pay

## 2024-03-09 DIAGNOSIS — T7840XA Allergy, unspecified, initial encounter: Secondary | ICD-10-CM | POA: Insufficient documentation

## 2024-03-12 ENCOUNTER — Other Ambulatory Visit: Payer: Self-pay

## 2024-03-12 ENCOUNTER — Ambulatory Visit: Attending: Cardiology | Admitting: Cardiology

## 2024-03-12 ENCOUNTER — Encounter: Payer: Self-pay | Admitting: Cardiology

## 2024-03-12 ENCOUNTER — Ambulatory Visit

## 2024-03-12 VITALS — BP 136/88 | HR 59 | Ht 65.0 in | Wt 231.2 lb

## 2024-03-12 DIAGNOSIS — R011 Cardiac murmur, unspecified: Secondary | ICD-10-CM

## 2024-03-12 DIAGNOSIS — I10 Essential (primary) hypertension: Secondary | ICD-10-CM | POA: Diagnosis not present

## 2024-03-12 DIAGNOSIS — R002 Palpitations: Secondary | ICD-10-CM | POA: Diagnosis not present

## 2024-03-12 NOTE — Patient Instructions (Signed)
 Medication Instructions:  Your physician recommends that you continue on your current medications as directed. Please refer to the Current Medication list given to you today.  *If you need a refill on your cardiac medications before your next appointment, please call your pharmacy*   Lab Work: None ordered If you have labs (blood work) drawn today and your tests are completely normal, you will receive your results only by: MyChart Message (if you have MyChart) OR A paper copy in the mail If you have any lab test that is abnormal or we need to change your treatment, we will call you to review the results.  Testing/Procedures: Your physician has requested that you have an echocardiogram. Echocardiography is a painless test that uses sound waves to create images of your heart. It provides your doctor with information about the size and shape of your heart and how well your heart's chambers and valves are working. This procedure takes approximately one hour. There are no restrictions for this procedure. Please do NOT wear cologne, perfume, aftershave, or lotions (deodorant is allowed). Please arrive 15 minutes prior to your appointment time.  Please note: We ask at that you not bring children with you during ultrasound (echo/ vascular) testing. Due to room size and safety concerns, children are not allowed in the ultrasound rooms during exams. Our front office staff cannot provide observation of children in our lobby area while testing is being conducted. An adult accompanying a patient to their appointment will only be allowed in the ultrasound room at the discretion of the ultrasound technician under special circumstances. We apologize for any inconvenience.  A zio monitor was ordered today. It will remain on for 14 days. Remove 03/26/24. You will then return monitor and event diary in provided box. It takes 1-2 weeks for report to be downloaded and returned to us . We will call you with the  results. If monitor falls off or has orange flashing light, please call Zio for further instructions.  Follow-Up: At [facility], you and your health needs are our priority.  As part of our continuing mission to provide you with exceptional heart care, we have created designated Provider Care Teams.  These Care Teams include your primary Cardiologist (physician) and Advanced Practice Providers (APPs -  Physician Assistants and Nurse Practitioners) who all work together to provide you with the care you need, when you need it.  We recommend signing up for the patient portal called MyChart.  Sign up information is provided on this After Visit Summary.  MyChart is used to connect with patients for Virtual Visits (Telemedicine).  Patients are able to view lab/test results, encounter notes, upcoming appointments, etc.  Non-urgent messages can be sent to your provider as well.   To learn more about what you can do with MyChart, go to [email or web address].    Your next appointment:   9 month(s)  The format for your next appointment:   In Person  Provider:   Jennifer Crape, MD   Other Instructions Echocardiogram An echocardiogram is a test that uses sound waves (ultrasound) to produce images of the heart. Images from an echocardiogram can provide important information about: Heart size and shape. The size and thickness and movement of your heart's walls. Heart muscle function and strength. Heart valve function or if you have stenosis. Stenosis is when the heart valves are too narrow. If blood is flowing backward through the heart valves (regurgitation). A tumor or infectious growth around the heart valves. Areas of heart muscle  that are not working well because of poor blood flow or injury from a heart attack. Aneurysm detection. An aneurysm is a weak or damaged part of an artery wall. The wall bulges out from the normal force of blood pumping through the body. Tell a health care  provider about: Any allergies you have. All medicines you are taking, including vitamins, herbs, eye drops, creams, and over-the-counter medicines. Any blood disorders you have. Any surgeries you have had. Any medical conditions you have. Whether you are pregnant or may be pregnant. What are the risks? Generally, this is a safe test. However, problems may occur, including an allergic reaction to dye (contrast) that may be used during the test. What happens before the test? No specific preparation is needed. You may eat and drink normally. What happens during the test? You will take off your clothes from the waist up and put on a hospital gown. Electrodes or electrocardiogram (ECG)patches may be placed on your chest. The electrodes or patches are then connected to a device that monitors your heart rate and rhythm. You will lie down on a table for an ultrasound exam. A gel will be applied to your chest to help sound waves pass through your skin. A handheld device, called a transducer, will be pressed against your chest and moved over your heart. The transducer produces sound waves that travel to your heart and bounce back (or echo back) to the transducer. These sound waves will be captured in real-time and changed into images of your heart that can be viewed on a video monitor. The images will be recorded on a computer and reviewed by your health care provider. You may be asked to change positions or hold your breath for a short time. This makes it easier to get different views or better views of your heart. In some cases, you may receive contrast through an IV in one of your veins. This can improve the quality of the pictures from your heart. The procedure may vary among health care providers and hospitals.   What can I expect after the test? You may return to your normal, everyday life, including diet, activities, and medicines, unless your health care provider tells you not to do that. Follow  these instructions at home: It is up to you to get the results of your test. Ask your health care provider, or the department that is doing the test, when your results will be ready. Keep all follow-up visits. This is important. Summary An echocardiogram is a test that uses sound waves (ultrasound) to produce images of the heart. Images from an echocardiogram can provide important information about the size and shape of your heart, heart muscle function, heart valve function, and other possible heart problems. You do not need to do anything to prepare before this test. You may eat and drink normally. After the echocardiogram is completed, you may return to your normal, everyday life, unless your health care provider tells you not to do that. This information is not intended to replace advice given to you by your health care provider. Make sure you discuss any questions you have with your health care provider. Document Revised: 01/12/2020 Document Reviewed: 01/12/2020 Elsevier Patient Education  2021 [employer]   Important Information About Sugar

## 2024-03-12 NOTE — Progress Notes (Signed)
 Cardiology Office Note:    Date:  03/12/2024   ID:  Kristy Keller, DOB [birth year], MRN [id number]  PCP:  Garald Karlynn GAILS, MD  Cardiologist:  Jennifer JONELLE Crape, MD   Referring MD: Garald Karlynn GAILS, MD    ASSESSMENT:    1. Benign essential hypertension   2. Morbid obesity ([name])   3. Palpitations    PLAN:    In order of problems listed above:  Primary prevention stressed with the patient.  Importance of compliance with diet medication stressed and patient verbalized standing. Palpitations: I discussed my findings with the patient at length.  Her symptoms do not appear to be terribly bad.  She has had no dizziness or syncope with this.  Will do a 50-month monitor to assess this. Essential hypertension: Blood pressure stable and diet was emphasized.  Lifestyle modification urged. Obesity: Weight reduction stressed diet emphasized and she promises to do better.  Risks of obesity explained. Cardiac murmur: Echocardiogram will be done to assess murmur heard on auscultation. Patient will be seen in follow-up appointment in 6 months or earlier if the patient has any concerns.    Medication Adjustments/Labs and Tests Ordered: Current medicines are reviewed at length with the patient today.  Concerns regarding medicines are outlined above.  Orders Placed This Encounter  Procedures   EKG 12-Lead   No orders of the defined types were placed in this encounter.    No chief complaint on file.    History of Present Illness:    Kristy Keller is a 63 y.o. female.  Patient has past medical history of essential hypertension, obesity palpitations.  She mentions to me that she is having fast heart rates on and off and feels the palpitations 2 or 3 times a day.  No chest pain orthopnea PND no history of dizziness or syncope.  She is concerned about the symptoms.  At the time of my evaluation, the patient is alert awake oriented and in no distress.  She is taking Mounjaro  and is  losing weight.  Past Medical History:  Diagnosis Date   Acute sinusitis 05/07/2023   L side  Omnicef  po bid     Prescribed cefdinir  and Promethazine  DM cough syrup     Age-related nuclear cataract, bilateral 06/04/2021   Allergy    Angina pectoris 02/28/2022   Anterior basement membrane dystrophy 03/12/2019   Arthrodesis status 05/07/2021   Benign essential hypertension 10/06/2019   Last Assessment & Plan:  Formatting of this note might be different from the original. Asymptomatic, with borderline but controlled blood pressure. Advised to continue monitoring her blood pressure, and to continue with dietary modifications. No pharmacological changes to her management at this visit.   Cat bite of hand, right, sequela 12/21/2020   Cellulitis of right hand 12/08/2020   Formatting of this note might be different from the original. Added automatically from request for surgery [id number]   Chest pain 02/19/2022   Chicken pox    Chronic venous insufficiency 11/07/2017   2019 B Support socks knee high Elevate legs Low salt diet Loose 10-20 lbs Spironolactone    Class 2 severe obesity due to excess calories with serious comorbidity in adult 10/06/2019   Classic migraine with aura 03/12/2019   COVID-19 10/01/2023   2nd   09/2023     Diabetes mellitus type 2 with complications ([name]) 12/03/2022   12/2022  New onset  Increase Ozempic  to 1 mg/d     Diabetes mellitus without complication ([name])  Dry eyes, bilateral 11/14/2023   Edema 06/05/2017   Due to gabapentin  - resolved off Rx 11/18  Spironolactone    Family history of early CAD 03/05/2017   Baby ASA qd   Family history of heart disease in brother 02/19/2022   Gallbladder pain 02/21/2018   Gallstone 03/05/2017   2014 large 2018 US : Cholelithiasis with confluence of gallstones measuring 2.5 cm in length, similar to prior study. No gallbladder wall thickening or pericholecystic fluid   History of hysterectomy 09/04/2022   Hypermetropia of both eyes  03/12/2019   Hypokalemia 02/19/2022   Infected sebaceous cyst 07/19/2017   2/19 back   Intertrigo 09/04/2022   Localized swelling, mass, or lump of lower extremity, bilateral 01/17/2022   Last Assessment & Plan:  Formatting of this note might be different from the original. The symmetric swelling and erythema, as well has her orthopnea, point towards a cardiac etiology as opposed to deep venous thrombosis. Worsening chronic venous insufficiency is possibly the sole cause of this, and at least a contributory etiology.  CBC, BMP, and pro-BNP ordered. Prescription for 40 mg of Lasix ,    Low back pain 09/04/2022   with radiculopathy with radiculopathy   Morbid obesity ([name]) 02/28/2022   Multilevel lumbosacral spondylosis with radiculopathy 04/12/2017   Nodule of left external ear 10/23/2021   Last Assessment & Plan:  Formatting of this note might be different from the original. Benign examination; possibly an area of thickened skin due to friction, dryness, and repeated manipulation by the patient. Advised she avoid alcohol application and use of neosporin. Suggested she try moisturizer to avoid dryness and further irritation.   Nonalcoholic fatty liver disease 02/19/2022   Nuclear sclerotic cataract of both eyes 03/12/2019   OAG (open angle glaucoma) suspect, low risk, bilateral 03/12/2019   Orthopnea 01/17/2022   Last Assessment & Plan:  Formatting of this note might be different from the original. Her cardiac workup in the hospital was reassuring, though her orthopnea and increased swelling in her extremities are concerning. Her lung exam is reassuring, but in consideration of recent events, a plain radiograph of the chest, CBC, BMP, and pro-BNP have been ordered.  Increasing her Lasix  to 40 mg twice dail   Pasteurella cellulitis due to cat bite 12/21/2020   Postlaminectomy syndrome of lumbar region 05/19/2021   Formatting of this note might be different from the original. Added automatically from  request for surgery [id number]   Preop exam for internal medicine 03/05/2017   [name] is medically clear for her L4 decompression/fusion. Thank you! EKG - nl Labs, CXR - OK Stress test discussed. [name] declined. She was very active prior to her radiculopathy w/o CPs Baby ASA qd   Presbyopia 03/12/2019   Pseudophakia of left eye 07/15/2022   Pseudophakia of right eye 07/15/2022   Pyogenic arthritis of hand ([name]) 12/21/2020   Formatting of this note might be different from the original. Added automatically from request for surgery [id number]   Radiculopathy of leg 03/06/2017   01/2017 Dr Marlyce  LBP - s/p L4 decompr and fusion surgery Apr 10, 2017. LBP and RLE pain is better Flexeril Loose weight Join a gentle yoga class   Regular astigmatism of both eyes 03/12/2019   Rupture of extensor tendon of right hand 12/09/2020   Formatting of this note might be different from the original. S/p repair Dr Jenna 12-09-2020   Sciatica, right side 06/04/2021   Seborrheic keratosis 09/04/2022   Spinal stenosis of lumbar region with neurogenic claudication 06/07/2021  Tinea corporis 09/17/2023   breast/pannus     Unstable angina ([name]) 02/19/2022   Weight gain 12/03/2022   Worse.  Discussed intermittent fasting diet  Phentermine  trial   Potential benefits of a long term phentermine  use as well as potential risks  and complications were explained to the patient and were aknowledged.      Past Surgical History:  Procedure Laterality Date   ABDOMINAL HYSTERECTOMY     partial   EYE SURGERY Bilateral 05/2022   Cataract    Current Medications: Current Meds  Medication Sig   acetaminophen  (TYLENOL ) 500 MG tablet Take 500 mg by mouth every 5 (five) hours as needed for headache.   aspirin  EC 81 MG tablet Take 1 tablet (81 mg total) by mouth daily. Swallow whole.   Cholecalciferol  (VITAMIN D3) 2000 units capsule Take 1 capsule (2,000 Units total) by mouth daily.   metoprolol  succinate (TOPROL -XL) 25 MG 24 hr  tablet TAKE 1 TABLET BY MOUTH EVERYDAY AT BEDTIME   nitroGLYCERIN  (NITROSTAT ) 0.4 MG SL tablet Place 0.4 mg under the tongue every 5 (five) minutes as needed for chest pain.   tirzepatide  (MOUNJARO ) 2.5 MG/0.5ML Pen Inject 2.5 mg into the skin once a week.   triamcinolone  ointment (KENALOG ) 0.1 % Apply 1 Application topically 2 (two) times daily.     Allergies:   Ketorolac , Gabapentin , Lyrica [pregabalin], and Phentermine    Social History   Socioeconomic History   Marital status: Married    Spouse name: Phillp   Number of children: Not on file   Years of education: Not on file   Highest education level: 11th grade  Occupational History   Occupation: Disabled  Tobacco Use   Smoking status: Former   Smokeless tobacco: Never  Advertising account planner   Vaping status: Never Used  Substance and Sexual Activity   Alcohol use: No   Drug use: No   Sexual activity: Never    Partners: Male  Other Topics Concern   Not on file  Social History Narrative   Lives with husband/ and 1 cat/2025   Social Drivers of Health   Financial Resource Strain: Low Risk  (02/07/2024)   Overall Financial Resource Strain (CARDIA)    Difficulty of Paying Living Expenses: Not hard at all  Food Insecurity: No Food Insecurity (02/07/2024)   Hunger Vital Sign    Worried About Running Out of Food in the Last Year: Never true    Ran Out of Food in the Last Year: Never true  Transportation Needs: No Transportation Needs (02/07/2024)   PRAPARE - Administrator, Civil Service (Medical): No    Lack of Transportation (Non-Medical): No  Physical Activity: Inactive (02/07/2024)   Exercise Vital Sign    Days of Exercise per Week: 0 days    Minutes of Exercise per Session: 0 min  Stress: Stress Concern Present (02/07/2024)   [employer] of Occupational Health - Occupational Stress Questionnaire    Feeling of Stress: To some extent  Social Connections: Socially Isolated (02/07/2024)   Social Connection and Isolation  Panel    Frequency of Communication with Friends and Family: Twice a week    Frequency of Social Gatherings with Friends and Family: Never    Attends Religious Services: Never    Database administrator or Organizations: No    Attends Engineer, structural: Never    Marital Status: Married     Family History: The patient's family history includes Cancer (age of onset: 51) in  her father; Diabetes in her sister; Heart disease (age of onset: 55) in her brother; Heart disease (age of onset: 47) in her mother. There is no history of Colon cancer, Esophageal cancer, Rectal cancer, or Stomach cancer.  ROS:   Please see the history of present illness.    All other systems reviewed and are negative.  EKGs/Labs/Other Studies Reviewed:    The following studies were reviewed today: Primary prevention stressed with the patient.  Importance of compliance with diet medication stressed and patient verbalized standing.    Recent Labs: 12/18/2023: ALT 46; BUN 10; Creatinine, Ser 0.84; Potassium 4.2; Sodium 142; TSH 0.94  Recent Lipid Panel    Component Value Date/Time   CHOL 152 11/26/2022 0834   TRIG 167 (H) 11/26/2022 0834   HDL 39 (L) 11/26/2022 0834   CHOLHDL 3.9 11/26/2022 0834   CHOLHDL 3 03/07/2017 1333   VLDL 14.6 03/07/2017 1333   LDLCALC 84 11/26/2022 0834    Physical Exam:    VS:  BP 136/88   Pulse (!) 59   Ht 5' 5 (1.651 m)   Wt 231 lb 3.2 oz (104.9 kg)   SpO2 95%   BMI 38.47 kg/m     Wt Readings from Last 3 Encounters:  03/12/24 231 lb 3.2 oz (104.9 kg)  02/07/24 239 lb 9.6 oz (108.7 kg)  12/18/23 246 lb (111.6 kg)     GEN: Patient is in no acute distress HEENT: Normal NECK: No JVD; No carotid bruits LYMPHATICS: No lymphadenopathy CARDIAC: Hear sounds regular, 2/6 systolic murmur at the apex. RESPIRATORY:  Clear to auscultation without rales, wheezing or rhonchi  ABDOMEN: Soft, non-tender, non-distended MUSCULOSKELETAL:  No edema; No deformity  SKIN:  Warm and dry NEUROLOGIC:  Alert and oriented x 3 PSYCHIATRIC:  Normal affect   Signed, Jennifer JONELLE Crape, MD  03/12/2024 3:34 PM    [facility]

## 2024-03-13 ENCOUNTER — Telehealth: Payer: Self-pay

## 2024-03-13 NOTE — Telephone Encounter (Signed)
 Copied from CRM [id number]. Topic: Appointments - Scheduling Inquiry for Clinic >> Mar 13, 2024 12:20 PM Mia F wrote: Reason for CRM: PT called stating she received a letter for approval for her injection. Pt may have called wrong office to set up the appt. She is checking with her ortho office to see where to schedule

## 2024-03-19 ENCOUNTER — Encounter: Payer: Self-pay | Admitting: Internal Medicine

## 2024-03-19 ENCOUNTER — Ambulatory Visit: Admitting: Internal Medicine

## 2024-03-19 ENCOUNTER — Ambulatory Visit (HOSPITAL_BASED_OUTPATIENT_CLINIC_OR_DEPARTMENT_OTHER): Admitting: Student

## 2024-03-19 VITALS — BP 124/78 | HR 65 | Temp 98.4°F | Ht 65.0 in | Wt 229.4 lb

## 2024-03-19 DIAGNOSIS — E118 Type 2 diabetes mellitus with unspecified complications: Secondary | ICD-10-CM | POA: Diagnosis not present

## 2024-03-19 DIAGNOSIS — Z6839 Body mass index (BMI) 39.0-39.9, adult: Secondary | ICD-10-CM

## 2024-03-19 DIAGNOSIS — E66812 Obesity, class 2: Secondary | ICD-10-CM | POA: Diagnosis not present

## 2024-03-19 DIAGNOSIS — E119 Type 2 diabetes mellitus without complications: Secondary | ICD-10-CM

## 2024-03-19 DIAGNOSIS — G8929 Other chronic pain: Secondary | ICD-10-CM

## 2024-03-19 DIAGNOSIS — I10 Essential (primary) hypertension: Secondary | ICD-10-CM

## 2024-03-19 DIAGNOSIS — M5441 Lumbago with sciatica, right side: Secondary | ICD-10-CM

## 2024-03-19 DIAGNOSIS — Z7985 Long-term (current) use of injectable non-insulin antidiabetic drugs: Secondary | ICD-10-CM

## 2024-03-19 DIAGNOSIS — M5442 Lumbago with sciatica, left side: Secondary | ICD-10-CM | POA: Diagnosis not present

## 2024-03-19 DIAGNOSIS — R002 Palpitations: Secondary | ICD-10-CM

## 2024-03-19 LAB — COMPREHENSIVE METABOLIC PANEL WITH GFR
ALT: 39 U/L — ABNORMAL HIGH (ref 0–35)
AST: 39 U/L — ABNORMAL HIGH (ref 0–37)
Albumin: 4.6 g/dL (ref 3.5–5.2)
Alkaline Phosphatase: 83 U/L (ref 39–117)
BUN: 13 mg/dL (ref 6–23)
CO2: 25 meq/L (ref 19–32)
Calcium: 9.4 mg/dL (ref 8.4–10.5)
Chloride: 106 meq/L (ref 96–112)
Creatinine, Ser: 0.89 mg/dL (ref 0.40–1.20)
GFR: 68.35 mL/min (ref >60.00)
Glucose, Bld: 95 mg/dL (ref 70–99)
Potassium: 4.3 meq/L (ref 3.5–5.1)
Sodium: 144 meq/L (ref 135–145)
Total Bilirubin: 0.9 mg/dL (ref 0.2–1.2)
Total Protein: 6.8 g/dL (ref 6.0–8.3)

## 2024-03-19 LAB — HEMOGLOBIN A1C: Hgb A1c MFr Bld: 5.5 % (ref 4.6–6.5)

## 2024-03-19 LAB — MAGNESIUM: Magnesium: 2.1 mg/dL (ref 1.5–2.5)

## 2024-03-19 MED ORDER — BLOOD GLUCOSE MONITORING SUPPL DEVI: 1.0000 | 0 refills | Status: AC

## 2024-03-19 MED ORDER — LANCET DEVICE MISC: 1.0000 | 0 refills | Status: AC

## 2024-03-19 MED ORDER — LANCETS MISC: 1.0000 | 0 refills | Status: AC

## 2024-03-19 MED ORDER — TIRZEPATIDE 5 MG/0.5ML ~~LOC~~ SOAJ: 5.0000 mg | SUBCUTANEOUS | 5 refills | Status: DC

## 2024-03-19 MED ORDER — BLOOD GLUCOSE TEST VI STRP: 1.0000 | ORAL_STRIP | 0 refills | Status: AC

## 2024-03-19 NOTE — Assessment & Plan Note (Signed)
 Kristy Keller was on Ozempic  (she did not loose much wt) Start Mounjaro  Check A1c

## 2024-03-19 NOTE — Assessment & Plan Note (Signed)
 Doing well

## 2024-03-19 NOTE — Assessment & Plan Note (Signed)
 Tirzepetide - increase the dose Check A1c

## 2024-03-19 NOTE — Assessment & Plan Note (Addendum)
 On Tirzepetide - lost 20 pounds then stopped- increase the dose Check A1c

## 2024-03-19 NOTE — Progress Notes (Signed)
 Subjective:  Patient ID: Kristy Keller, female    DOB: Oct 29, 1960  Age: 63 y.o. MRN: [id number]  CC: Medical Management of Chronic Issues (3 Month follow up)   HPI [name] presents for DM, edema, obesity Pt lost 20 lbs  Outpatient Medications Prior to Visit  Medication Sig Dispense Refill   acetaminophen  (TYLENOL ) 500 MG tablet Take 500 mg by mouth every 5 (five) hours as needed for headache.     aspirin  EC 81 MG tablet Take 1 tablet (81 mg total) by mouth daily. Swallow whole. 90 tablet 3   Cholecalciferol  (VITAMIN D3) 2000 units capsule Take 1 capsule (2,000 Units total) by mouth daily. 100 capsule 3   metoprolol  succinate (TOPROL -XL) 25 MG 24 hr tablet TAKE 1 TABLET BY MOUTH EVERYDAY AT BEDTIME 90 tablet 3   nitroGLYCERIN  (NITROSTAT ) 0.4 MG SL tablet Place 0.4 mg under the tongue every 5 (five) minutes as needed for chest pain.     triamcinolone  ointment (KENALOG ) 0.1 % Apply 1 Application topically 2 (two) times daily. 80 g 1   tirzepatide  (MOUNJARO ) 2.5 MG/0.5ML Pen Inject 2.5 mg into the skin once a week. 2 mL 2   No facility-administered medications prior to visit.    ROS: Review of Systems  Constitutional:  Negative for activity change, appetite change, chills, fatigue and unexpected weight change.  HENT:  Negative for congestion, mouth sores and sinus pressure.   Eyes:  Negative for visual disturbance.  Respiratory:  Negative for cough and chest tightness.   Cardiovascular:  Positive for palpitations. Negative for leg swelling.  Gastrointestinal:  Negative for abdominal pain and nausea.  Genitourinary:  Negative for difficulty urinating, frequency and vaginal pain.  Musculoskeletal:  Positive for back pain. Negative for gait problem.  Skin:  Negative for pallor and rash.  Neurological:  Negative for dizziness, tremors, weakness, numbness and headaches.  Psychiatric/Behavioral:  Negative for confusion, sleep disturbance and suicidal ideas.      Objective:  BP 124/78   Pulse 65   Temp 98.4 F (36.9 C)   Ht 5' 5 (1.651 m)   Wt 229 lb 6.4 oz (104.1 kg)   SpO2 99%   BMI 38.17 kg/m   BP Readings from Last 3 Encounters:  03/19/24 124/78  03/12/24 136/88  02/07/24 (!) 140/90    Wt Readings from Last 3 Encounters:  03/19/24 229 lb 6.4 oz (104.1 kg)  03/12/24 231 lb 3.2 oz (104.9 kg)  02/07/24 239 lb 9.6 oz (108.7 kg)    Physical Exam Constitutional:      General: She is not in acute distress.    Appearance: She is well-developed. She is obese.  HENT:     Head: Normocephalic.     Right Ear: External ear normal.     Left Ear: External ear normal.     Nose: Nose normal.  Eyes:     General:        Right eye: No discharge.        Left eye: No discharge.     Conjunctiva/sclera: Conjunctivae normal.     Pupils: Pupils are equal, round, and reactive to light.  Neck:     Thyroid : No thyromegaly.     Vascular: No JVD.     Trachea: No tracheal deviation.  Cardiovascular:     Rate and Rhythm: Normal rate and regular rhythm.     Heart sounds: Normal heart sounds.  Pulmonary:     Effort: No respiratory distress.  Breath sounds: No stridor. No wheezing.  Abdominal:     General: Bowel sounds are normal. There is no distension.     Palpations: Abdomen is soft. There is no mass.     Tenderness: There is no abdominal tenderness. There is no guarding or rebound.  Musculoskeletal:        General: No tenderness.     Cervical back: Normal range of motion and neck supple. No rigidity.  Lymphadenopathy:     Cervical: No cervical adenopathy.  Skin:    Findings: No erythema or rash.  Neurological:     Mental Status: She is oriented to person, place, and time.     Cranial Nerves: No cranial nerve deficit.     Motor: No abnormal muscle tone.     Coordination: Coordination normal.     Deep Tendon Reflexes: Reflexes normal.  Psychiatric:        Behavior: Behavior normal.        Thought Content: Thought content normal.         Judgment: Judgment normal.   Zio patch is on  Lab Results  Component Value Date   WBC 4.3 11/26/2022   HGB 14.0 11/26/2022   HCT 42.6 11/26/2022   PLT 224 11/26/2022   GLUCOSE 144 (H) 12/18/2023   CHOL 152 11/26/2022   TRIG 167 (H) 11/26/2022   HDL 39 (L) 11/26/2022   LDLCALC 84 11/26/2022   ALT 46 (H) 12/18/2023   AST 52 (H) 12/18/2023   NA 142 12/18/2023   K 4.2 12/18/2023   CL 104 12/18/2023   CREATININE 0.84 12/18/2023   BUN 10 12/18/2023   CO2 29 12/18/2023   TSH 0.94 12/18/2023   INR 1.1 (H) 03/07/2017   HGBA1C 7.3 (H) 12/18/2023    MM 3D SCREENING MAMMOGRAM BILATERAL BREAST Result Date: 03/21/2023 CLINICAL DATA:  Screening. EXAM: DIGITAL SCREENING BILATERAL MAMMOGRAM WITH TOMOSYNTHESIS AND CAD TECHNIQUE: Bilateral screening digital craniocaudal and mediolateral oblique mammograms were obtained. Bilateral screening digital breast tomosynthesis was performed. The images were evaluated with computer-aided detection. COMPARISON:  Previous exam(s). ACR Breast Density Category a: The breasts are almost entirely fatty. FINDINGS: There are no findings suspicious for malignancy. IMPRESSION: No mammographic evidence of malignancy. A result letter of this screening mammogram will be mailed directly to the patient. RECOMMENDATION: Screening mammogram in one year. (Code:SM-B-01Y) BI-RADS CATEGORY  1: Negative. Electronically Signed   By: [name] M.D.   On: 03/21/2023 15:56    Assessment & Plan:   Problem List Items Addressed This Visit     Benign essential hypertension   BP Readings from Last 3 Encounters:  03/19/24 124/78  03/12/24 136/88  02/07/24 (!) 140/90          Class 2 severe obesity due to excess calories with serious comorbidity in adult   On Tirzepetide - lost 20 pounds then stopped- increase the dose Check A1c      Relevant Medications   tirzepatide  (MOUNJARO ) 5 MG/0.5ML Pen   Other Relevant Orders   Magnesium   Diabetes mellitus type 2  with complications ([name]) - Primary   Tirzepetide - increase the dose Check A1c      Relevant Medications   tirzepatide  (MOUNJARO ) 5 MG/0.5ML Pen   Other Relevant Orders   Comprehensive metabolic panel with GFR   Hemoglobin A1c   Magnesium   Diabetes mellitus without complication ([name])   [name] was on Ozempic  (she did not loose much wt) Start Mounjaro  Check A1c  Relevant Medications   tirzepatide  (MOUNJARO ) 5 MG/0.5ML Pen   Low back pain   Doing well      Palpitation   On Zio patch monitor Check magnesium         Meds ordered this encounter  Medications   tirzepatide  (MOUNJARO ) 5 MG/0.5ML Pen    Sig: Inject 5 mg into the skin once a week.    Dispense:  2 mL    Refill:  5   Blood Glucose Monitoring Suppl DEVI    Sig: 1 each by Does not apply route as directed. Dispense based on patient and insurance preference. Use up to four times daily as directed. (FOR ICD-10 E10.9, E11.9).    Dispense:  1 each    Refill:  0   Glucose Blood (BLOOD GLUCOSE TEST STRIPS) STRP    Sig: 1 each by Does not apply route as directed. Dispense based on patient and insurance preference. Use up to four times daily as directed. (FOR ICD-10 E10.9, E11.9).    Dispense:  100 strip    Refill:  0   Lancet Device MISC    Sig: 1 each by Does not apply route as directed. Dispense based on patient and insurance preference. Use up to four times daily as directed. (FOR ICD-10 E10.9, E11.9).    Dispense:  1 each    Refill:  0   Lancets MISC    Sig: 1 each by Does not apply route as directed. Dispense based on patient and insurance preference. Use up to four times daily as directed. (FOR ICD-10 E10.9, E11.9).    Dispense:  100 each    Refill:  0      Follow-up: Return in about 3 months (around 06/19/2024) for a follow-up visit.  Marolyn Noel, MD

## 2024-03-19 NOTE — Assessment & Plan Note (Signed)
 BP Readings from Last 3 Encounters:  03/19/24 124/78  03/12/24 136/88  02/07/24 (!) 140/90

## 2024-03-19 NOTE — Assessment & Plan Note (Addendum)
 On Zio patch monitor Check magnesium

## 2024-03-20 ENCOUNTER — Ambulatory Visit (INDEPENDENT_AMBULATORY_CARE_PROVIDER_SITE_OTHER): Admitting: Student

## 2024-03-20 ENCOUNTER — Ambulatory Visit: Payer: Self-pay | Admitting: Internal Medicine

## 2024-03-20 DIAGNOSIS — M1711 Unilateral primary osteoarthritis, right knee: Secondary | ICD-10-CM | POA: Diagnosis not present

## 2024-03-20 MED ORDER — HYALURONAN 88 MG/4ML IX SOSY
88.0000 mg | PREFILLED_SYRINGE | INTRA_ARTICULAR | Status: AC | PRN
  Administered 2024-03-20: 88 mg via INTRA_ARTICULAR

## 2024-03-20 MED ORDER — LIDOCAINE HCL 1 % IJ SOLN
4.0000 mL | INTRAMUSCULAR | Status: AC | PRN
  Administered 2024-03-20: 4 mL

## 2024-03-20 NOTE — Progress Notes (Signed)
     HPI: Patient presents today for Monovisc injection of the right knee.  She unfortunately did not get any relief from her last cortisone injection.  Pain continues to be most noticeable medially.  She is continuing with weight loss on Mounjaro .   Physical Exam: Exam of the right knee demonstrates no significant effusion, erythema, or warmth.  Active range of motion from 0 to 120 degrees with mild crepitus.  Patient ambulating with antalgic gait without assistive device.   Procedure Note  Patient: Kristy Keller             Date of Birth: 1960/12/23           MRN: [id number]             Visit Date: 03/20/2024  Procedures: Visit Diagnoses:  1. Unilateral primary osteoarthritis, right knee     Large Joint Inj: R knee on 03/20/2024 8:48 AM Indications: pain Details: 22 G 1.5 in needle, anterolateral approach Medications: 4 mL lidocaine 1 %; 88 mg Hyaluronan 88 MG/4ML Outcome: tolerated well, no immediate complications Procedure, treatment alternatives, risks and benefits explained, specific risks discussed. Consent was given by the patient. Immediately prior to procedure a time out was called to verify the correct patient, procedure, equipment, support staff and site/side marked as required. Patient was prepped and draped in the usual sterile fashion.       Plan: Follow-up as needed    I personally saw and evaluated the patient, and participated in the management and treatment plan.  Leonce Reveal, PA-C Orthopedics

## 2024-03-23 ENCOUNTER — Telehealth: Payer: Self-pay | Admitting: Internal Medicine

## 2024-03-23 NOTE — Telephone Encounter (Signed)
 Called and spoke with a representative from [facility]. Confirmed with them the situation and informed them of patient diagnoses and codes. She was able to provide me with a confirmation number below.   Informed her that we had not received any of the faxes documented on [employer]'s side.    Confirmation: [id number]#

## 2024-03-23 NOTE — Telephone Encounter (Signed)
 Copied from CRM #[id number]. Topic: Clinical - Medical Advice >> Mar 23, 2024  8:56 AM Tiffini S wrote: Reason for CRM: Insurance have sent over seven faxes request/ last request was sent on October 13th- never received letter from nurse at the last visit  Patient have Type 2 diabetic and a heart condition- they need a call from the doctor office at [phone] to verify over the phone/ anyone can call  Patient states that she could lose her health insurance if this is not done  Please call the patient at [phone] for an update

## 2024-03-23 NOTE — Telephone Encounter (Signed)
 Called and informed patient of response from [employer]. Informed her we would keep her up to date if anything else is received

## 2024-03-31 DIAGNOSIS — R002 Palpitations: Secondary | ICD-10-CM | POA: Diagnosis not present

## 2024-04-01 ENCOUNTER — Ambulatory Visit: Payer: Self-pay | Admitting: Cardiology

## 2024-04-01 DIAGNOSIS — R002 Palpitations: Secondary | ICD-10-CM | POA: Diagnosis not present

## 2024-04-01 DIAGNOSIS — I471 Supraventricular tachycardia, unspecified: Secondary | ICD-10-CM

## 2024-04-02 ENCOUNTER — Ambulatory Visit: Attending: Cardiology

## 2024-04-02 DIAGNOSIS — R011 Cardiac murmur, unspecified: Secondary | ICD-10-CM

## 2024-04-02 LAB — ECHOCARDIOGRAM COMPLETE
AR max vel: 1.66 cm2
AV Area VTI: 1.84 cm2
AV Area mean vel: 1.64 cm2
AV Mean grad: 6.5 mmHg
AV Peak grad: 12.7 mmHg
Ao pk vel: 1.78 m/s
Area-P 1/2: 3.11 cm2
MV M vel: 2.92 m/s
MV Peak grad: 34.1 mmHg
MV VTI: 1.48 cm2
S' Lateral: 3.2 cm

## 2024-04-02 MED ORDER — METOPROLOL SUCCINATE ER 50 MG PO TB24: 50.0000 mg | ORAL_TABLET | Freq: Every day | ORAL | 3 refills | Status: DC

## 2024-04-06 ENCOUNTER — Encounter: Payer: Self-pay | Admitting: Radiology

## 2024-04-09 DIAGNOSIS — I8311 Varicose veins of right lower extremity with inflammation: Secondary | ICD-10-CM | POA: Diagnosis not present

## 2024-04-09 DIAGNOSIS — M79661 Pain in right lower leg: Secondary | ICD-10-CM | POA: Diagnosis not present

## 2024-04-09 DIAGNOSIS — I83813 Varicose veins of bilateral lower extremities with pain: Secondary | ICD-10-CM | POA: Diagnosis not present

## 2024-04-09 DIAGNOSIS — I8312 Varicose veins of left lower extremity with inflammation: Secondary | ICD-10-CM | POA: Diagnosis not present

## 2024-04-09 DIAGNOSIS — I83893 Varicose veins of bilateral lower extremities with other complications: Secondary | ICD-10-CM | POA: Diagnosis not present

## 2024-04-09 DIAGNOSIS — M7989 Other specified soft tissue disorders: Secondary | ICD-10-CM | POA: Diagnosis not present

## 2024-04-17 DIAGNOSIS — E119 Type 2 diabetes mellitus without complications: Secondary | ICD-10-CM | POA: Diagnosis not present

## 2024-04-17 DIAGNOSIS — H40013 Open angle with borderline findings, low risk, bilateral: Secondary | ICD-10-CM | POA: Diagnosis not present

## 2024-04-17 DIAGNOSIS — Z961 Presence of intraocular lens: Secondary | ICD-10-CM | POA: Diagnosis not present

## 2024-04-17 DIAGNOSIS — H18523 Epithelial (juvenile) corneal dystrophy, bilateral: Secondary | ICD-10-CM | POA: Diagnosis not present

## 2024-04-17 DIAGNOSIS — H26493 Other secondary cataract, bilateral: Secondary | ICD-10-CM | POA: Diagnosis not present

## 2024-04-17 DIAGNOSIS — H04123 Dry eye syndrome of bilateral lacrimal glands: Secondary | ICD-10-CM | POA: Diagnosis not present

## 2024-04-22 DIAGNOSIS — I872 Venous insufficiency (chronic) (peripheral): Secondary | ICD-10-CM | POA: Diagnosis not present

## 2024-04-23 DIAGNOSIS — I872 Venous insufficiency (chronic) (peripheral): Secondary | ICD-10-CM | POA: Diagnosis not present

## 2024-04-23 DIAGNOSIS — Z09 Encounter for follow-up examination after completed treatment for conditions other than malignant neoplasm: Secondary | ICD-10-CM | POA: Diagnosis not present

## 2024-04-23 DIAGNOSIS — I83812 Varicose veins of left lower extremities with pain: Secondary | ICD-10-CM | POA: Diagnosis not present

## 2024-04-28 DIAGNOSIS — Z09 Encounter for follow-up examination after completed treatment for conditions other than malignant neoplasm: Secondary | ICD-10-CM | POA: Diagnosis not present

## 2024-04-28 DIAGNOSIS — I83892 Varicose veins of left lower extremities with other complications: Secondary | ICD-10-CM | POA: Diagnosis not present

## 2024-04-28 DIAGNOSIS — I87391 Chronic venous hypertension (idiopathic) with other complications of right lower extremity: Secondary | ICD-10-CM | POA: Diagnosis not present

## 2024-04-29 DIAGNOSIS — I83891 Varicose veins of right lower extremities with other complications: Secondary | ICD-10-CM | POA: Diagnosis not present

## 2024-05-06 DIAGNOSIS — I83892 Varicose veins of left lower extremities with other complications: Secondary | ICD-10-CM | POA: Diagnosis not present

## 2024-05-06 DIAGNOSIS — I87392 Chronic venous hypertension (idiopathic) with other complications of left lower extremity: Secondary | ICD-10-CM | POA: Diagnosis not present

## 2024-05-07 DIAGNOSIS — I83891 Varicose veins of right lower extremities with other complications: Secondary | ICD-10-CM | POA: Diagnosis not present

## 2024-05-18 ENCOUNTER — Encounter: Payer: Self-pay | Admitting: Pharmacist

## 2024-05-18 NOTE — Progress Notes (Signed)
 Pharmacy Quality Measure Review  This patient is appearing on a report for being at risk of failing the adherence measure for diabetes medications this calendar year.   Medication: Mounjaro  Last fill date: 04/21/24 for 28 day supply  Insurance report was not up to date. No action needed at this time.   Of note, pt not currently prescribed a statin and do not see history of statin in med list. Left note on upcoming PCP appt that statin is recommended given dx of diabetes and LDL goal <70.  Darrelyn Drum, PharmD, BCPS, CPP Clinical Pharmacist Practitioner [facility] [phone]

## 2024-06-22 ENCOUNTER — Encounter: Payer: Self-pay | Admitting: Internal Medicine

## 2024-06-22 ENCOUNTER — Ambulatory Visit: Admitting: Internal Medicine

## 2024-06-22 VITALS — BP 136/78 | HR 66 | Ht 65.0 in | Wt 212.4 lb

## 2024-06-22 DIAGNOSIS — Z7985 Long-term (current) use of injectable non-insulin antidiabetic drugs: Secondary | ICD-10-CM | POA: Diagnosis not present

## 2024-06-22 DIAGNOSIS — R609 Edema, unspecified: Secondary | ICD-10-CM | POA: Diagnosis not present

## 2024-06-22 DIAGNOSIS — Z6839 Body mass index (BMI) 39.0-39.9, adult: Secondary | ICD-10-CM | POA: Diagnosis not present

## 2024-06-22 DIAGNOSIS — E66812 Obesity, class 2: Secondary | ICD-10-CM | POA: Diagnosis not present

## 2024-06-22 DIAGNOSIS — E118 Type 2 diabetes mellitus with unspecified complications: Secondary | ICD-10-CM | POA: Diagnosis not present

## 2024-06-22 DIAGNOSIS — I471 Supraventricular tachycardia, unspecified: Secondary | ICD-10-CM | POA: Diagnosis not present

## 2024-06-22 MED ORDER — METOPROLOL TARTRATE 25 MG PO TABS: 25.0000 mg | ORAL_TABLET | Freq: Two times a day (BID) | ORAL | 3 refills | Status: AC

## 2024-06-22 MED ORDER — TIRZEPATIDE 5 MG/0.5ML ~~LOC~~ SOAJ: 5.0000 mg | SUBCUTANEOUS | 5 refills | Status: AC

## 2024-06-22 NOTE — Assessment & Plan Note (Signed)
 Pt lost 40 pounds - no edema

## 2024-06-22 NOTE — Assessment & Plan Note (Addendum)
 On Tirzepetide - lost 40 pounds then stopped Check A1c

## 2024-06-22 NOTE — Assessment & Plan Note (Signed)
 On Toprol  XL 25 mg bid

## 2024-06-22 NOTE — Progress Notes (Signed)
 "  Subjective:  Patient ID: Kristy Keller, female    DOB: 1960/12/07  Age: 64 y.o. MRN: [id number]  CC: Medical Management of Chronic Issues (Follow up on mounjaro , type 2 diabetes mellitus follow up)   HPI [name] presents for DM2, SVT, obesity  Outpatient Medications Prior to Visit  Medication Sig Dispense Refill   acetaminophen  (TYLENOL ) 500 MG tablet Take 500 mg by mouth every 5 (five) hours as needed for headache.     aspirin  EC 81 MG tablet Take 1 tablet (81 mg total) by mouth daily. Swallow whole. 90 tablet 3   Blood Glucose Monitoring Suppl DEVI 1 each by Does not apply route as directed. Dispense based on patient and insurance preference. Use up to four times daily as directed. (FOR ICD-10 E10.9, E11.9). 1 each 0   Cholecalciferol  (VITAMIN D3) 2000 units capsule Take 1 capsule (2,000 Units total) by mouth daily. 100 capsule 3   Glucose Blood (BLOOD GLUCOSE TEST STRIPS) STRP 1 each by Does not apply route as directed. Dispense based on patient and insurance preference. Use up to four times daily as directed. (FOR ICD-10 E10.9, E11.9). 100 strip 0   Lancet Device MISC 1 each by Does not apply route as directed. Dispense based on patient and insurance preference. Use up to four times daily as directed. (FOR ICD-10 E10.9, E11.9). 1 each 0   Lancets MISC 1 each by Does not apply route as directed. Dispense based on patient and insurance preference. Use up to four times daily as directed. (FOR ICD-10 E10.9, E11.9). 100 each 0   nitroGLYCERIN  (NITROSTAT ) 0.4 MG SL tablet Place 0.4 mg under the tongue every 5 (five) minutes as needed for chest pain.     triamcinolone  ointment (KENALOG ) 0.1 % Apply 1 Application topically 2 (two) times daily. 80 g 1   metoprolol  succinate (TOPROL -XL) 50 MG 24 hr tablet Take 1 tablet (50 mg total) by mouth daily. 90 tablet 3   tirzepatide  (MOUNJARO ) 5 MG/0.5ML Pen Inject 5 mg into the skin once a week. 2 mL 5   No facility-administered  medications prior to visit.    ROS: Review of Systems  Constitutional:  Negative for activity change, appetite change, chills, fatigue and unexpected weight change.  HENT:  Negative for congestion, mouth sores and sinus pressure.   Eyes:  Negative for visual disturbance.  Respiratory:  Negative for cough and chest tightness.   Gastrointestinal:  Negative for abdominal pain and nausea.  Genitourinary:  Negative for difficulty urinating, frequency and vaginal pain.  Musculoskeletal:  Negative for back pain and gait problem.  Skin:  Negative for pallor and rash.  Neurological:  Negative for dizziness, tremors, weakness, numbness and headaches.  Psychiatric/Behavioral:  Negative for confusion and sleep disturbance.     Objective:  BP 136/78   Pulse 66   Ht 5' 5 (1.651 m)   Wt 212 lb 6.4 oz (96.3 kg)   SpO2 95%   BMI 35.35 kg/m   BP Readings from Last 3 Encounters:  06/22/24 136/78  03/19/24 124/78  03/12/24 136/88    Wt Readings from Last 3 Encounters:  06/22/24 212 lb 6.4 oz (96.3 kg)  03/19/24 229 lb 6.4 oz (104.1 kg)  03/12/24 231 lb 3.2 oz (104.9 kg)    Physical Exam Constitutional:      General: She is not in acute distress.    Appearance: She is well-developed. She is obese.  HENT:     Head: Normocephalic.  Right Ear: External ear normal.     Left Ear: External ear normal.     Nose: Nose normal.  Eyes:     General:        Right eye: No discharge.        Left eye: No discharge.     Conjunctiva/sclera: Conjunctivae normal.     Pupils: Pupils are equal, round, and reactive to light.  Neck:     Thyroid : No thyromegaly.     Vascular: No JVD.     Trachea: No tracheal deviation.  Cardiovascular:     Rate and Rhythm: Normal rate and regular rhythm.     Heart sounds: Normal heart sounds.  Pulmonary:     Effort: No respiratory distress.     Breath sounds: No stridor. No wheezing.  Abdominal:     General: Bowel sounds are normal. There is no distension.      Palpations: Abdomen is soft. There is no mass.     Tenderness: There is no abdominal tenderness. There is no guarding or rebound.  Musculoskeletal:        General: No tenderness.     Cervical back: Normal range of motion and neck supple. No rigidity.  Lymphadenopathy:     Cervical: No cervical adenopathy.  Skin:    Findings: No erythema or rash.  Neurological:     Cranial Nerves: No cranial nerve deficit.     Motor: No abnormal muscle tone.     Coordination: Coordination normal.     Deep Tendon Reflexes: Reflexes normal.  Psychiatric:        Behavior: Behavior normal.        Thought Content: Thought content normal.        Judgment: Judgment normal.     Lab Results  Component Value Date   WBC 4.3 11/26/2022   HGB 14.0 11/26/2022   HCT 42.6 11/26/2022   PLT 224 11/26/2022   GLUCOSE 95 03/19/2024   CHOL 152 11/26/2022   TRIG 167 (H) 11/26/2022   HDL 39 (L) 11/26/2022   LDLCALC 84 11/26/2022   ALT 39 (H) 03/19/2024   AST 39 (H) 03/19/2024   NA 144 03/19/2024   K 4.3 03/19/2024   CL 106 03/19/2024   CREATININE 0.89 03/19/2024   BUN 13 03/19/2024   CO2 25 03/19/2024   TSH 0.94 12/18/2023   INR 1.1 (H) 03/07/2017   HGBA1C 5.5 03/19/2024    MM 3D SCREENING MAMMOGRAM BILATERAL BREAST Result Date: 03/21/2023 CLINICAL DATA:  Screening. EXAM: DIGITAL SCREENING BILATERAL MAMMOGRAM WITH TOMOSYNTHESIS AND CAD TECHNIQUE: Bilateral screening digital craniocaudal and mediolateral oblique mammograms were obtained. Bilateral screening digital breast tomosynthesis was performed. The images were evaluated with computer-aided detection. COMPARISON:  Previous exam(s). ACR Breast Density Category a: The breasts are almost entirely fatty. FINDINGS: There are no findings suspicious for malignancy. IMPRESSION: No mammographic evidence of malignancy. A result letter of this screening mammogram will be mailed directly to the patient. RECOMMENDATION: Screening mammogram in one year. (Code:[id number]-B-01Y)  BI-RADS CATEGORY  1: Negative. Electronically Signed   By: [name] M.D.   On: 03/21/2023 15:56    Assessment & Plan:   Problem List Items Addressed This Visit     Edema   Pt lost 40 pounds - no edema      Class 2 severe obesity due to excess calories with serious comorbidity in adult - Primary   On Tirzepetide - lost 40 pounds then stopped Check A1c      Relevant  Medications   tirzepatide  (MOUNJARO ) 5 MG/0.5ML Pen   SVT (supraventricular tachycardia)   On Toprol  XL 25 mg bid      Relevant Medications   metoprolol  tartrate (LOPRESSOR ) 25 MG tablet   Diabetes mellitus type 2 with complications ([name])   Pt lost 40 pounds - no edema      Relevant Medications   tirzepatide  (MOUNJARO ) 5 MG/0.5ML Pen   Other Relevant Orders   Comprehensive metabolic panel with GFR   Hemoglobin A1c      Meds ordered this encounter  Medications   metoprolol  tartrate (LOPRESSOR ) 25 MG tablet    Sig: Take 1 tablet (25 mg total) by mouth 2 (two) times daily.    Dispense:  180 tablet    Refill:  3   tirzepatide  (MOUNJARO ) 5 MG/0.5ML Pen    Sig: Inject 5 mg into the skin once a week.    Dispense:  2 mL    Refill:  5      Follow-up: Return in about 3 months (around 09/20/2024) for a follow-up visit.  Marolyn Noel, MD "

## 2024-09-21 ENCOUNTER — Ambulatory Visit: Admitting: Internal Medicine

## 2025-02-11 ENCOUNTER — Ambulatory Visit
